# Patient Record
Sex: Male | Born: 1940 | Race: White | Hispanic: No | Marital: Married | Smoking: Current every day smoker
Health system: Southern US, Community
[De-identification: ages and names within clinical notes are randomized; demographics above are authoritative.]

## PROBLEM LIST (undated history)

## (undated) DIAGNOSIS — R569 Unspecified convulsions: Secondary | ICD-10-CM

## (undated) DIAGNOSIS — I10 Essential (primary) hypertension: Secondary | ICD-10-CM

## (undated) DIAGNOSIS — E7800 Pure hypercholesterolemia, unspecified: Secondary | ICD-10-CM

## (undated) DIAGNOSIS — J449 Chronic obstructive pulmonary disease, unspecified: Secondary | ICD-10-CM

## (undated) DIAGNOSIS — R0602 Shortness of breath: Secondary | ICD-10-CM

## (undated) HISTORY — PX: ORTHOPEDIC SURGERY: SHX850

## (undated) HISTORY — PX: BACK SURGERY: SHX140

## (undated) HISTORY — PX: CERVICAL DISC SURGERY: SHX588

---

## 2001-10-08 ENCOUNTER — Encounter: Payer: Self-pay | Admitting: Emergency Medicine

## 2001-10-08 ENCOUNTER — Inpatient Hospital Stay (HOSPITAL_COMMUNITY): Admission: EM | Admit: 2001-10-08 | Discharge: 2001-10-10 | Payer: Self-pay | Admitting: Emergency Medicine

## 2001-10-09 ENCOUNTER — Encounter: Payer: Self-pay | Admitting: Neurology

## 2001-10-09 ENCOUNTER — Encounter: Payer: Self-pay | Admitting: General Surgery

## 2001-11-14 ENCOUNTER — Encounter: Payer: Self-pay | Admitting: Neurosurgery

## 2001-11-18 ENCOUNTER — Inpatient Hospital Stay (HOSPITAL_COMMUNITY): Admission: RE | Admit: 2001-11-18 | Discharge: 2001-11-20 | Payer: Self-pay | Admitting: Neurosurgery

## 2001-11-18 ENCOUNTER — Encounter: Payer: Self-pay | Admitting: Neurosurgery

## 2011-04-15 ENCOUNTER — Other Ambulatory Visit: Payer: Self-pay

## 2011-04-15 ENCOUNTER — Emergency Department (HOSPITAL_COMMUNITY): Payer: Medicare Other

## 2011-04-15 ENCOUNTER — Inpatient Hospital Stay (HOSPITAL_COMMUNITY)
Admission: EM | Admit: 2011-04-15 | Discharge: 2011-04-19 | DRG: 194 | Disposition: A | Discharge status: Home / Self Care | Payer: Medicare Other | Attending: Internal Medicine | Admitting: Internal Medicine

## 2011-04-15 ENCOUNTER — Encounter (HOSPITAL_COMMUNITY): Payer: Self-pay | Admitting: Emergency Medicine

## 2011-04-15 DIAGNOSIS — E785 Hyperlipidemia, unspecified: Secondary | ICD-10-CM

## 2011-04-15 DIAGNOSIS — J069 Acute upper respiratory infection, unspecified: Secondary | ICD-10-CM | POA: Diagnosis present

## 2011-04-15 DIAGNOSIS — E119 Type 2 diabetes mellitus without complications: Secondary | ICD-10-CM

## 2011-04-15 DIAGNOSIS — F17200 Nicotine dependence, unspecified, uncomplicated: Secondary | ICD-10-CM | POA: Diagnosis present

## 2011-04-15 DIAGNOSIS — J09X2 Influenza due to identified novel influenza A virus with other respiratory manifestations: Principal | ICD-10-CM | POA: Diagnosis present

## 2011-04-15 DIAGNOSIS — R946 Abnormal results of thyroid function studies: Secondary | ICD-10-CM | POA: Diagnosis present

## 2011-04-15 DIAGNOSIS — Z720 Tobacco use: Secondary | ICD-10-CM

## 2011-04-15 DIAGNOSIS — I10 Essential (primary) hypertension: Secondary | ICD-10-CM | POA: Diagnosis present

## 2011-04-15 DIAGNOSIS — Z9181 History of falling: Secondary | ICD-10-CM

## 2011-04-15 DIAGNOSIS — R5381 Other malaise: Secondary | ICD-10-CM | POA: Diagnosis present

## 2011-04-15 DIAGNOSIS — J441 Chronic obstructive pulmonary disease with (acute) exacerbation: Secondary | ICD-10-CM | POA: Diagnosis present

## 2011-04-15 DIAGNOSIS — R509 Fever, unspecified: Secondary | ICD-10-CM | POA: Diagnosis present

## 2011-04-15 DIAGNOSIS — R6510 Systemic inflammatory response syndrome (SIRS) of non-infectious origin without acute organ dysfunction: Secondary | ICD-10-CM | POA: Diagnosis present

## 2011-04-15 DIAGNOSIS — E871 Hypo-osmolality and hyponatremia: Secondary | ICD-10-CM | POA: Diagnosis present

## 2011-04-15 HISTORY — DX: Essential (primary) hypertension: I10

## 2011-04-15 HISTORY — DX: Pure hypercholesterolemia, unspecified: E78.00

## 2011-04-15 LAB — DIFFERENTIAL
Basophils Relative: 0 % (ref 0–1)
Eosinophils Absolute: 0 10*3/uL (ref 0.0–0.7)
Lymphs Abs: 0.4 10*3/uL — ABNORMAL LOW (ref 0.7–4.0)
Monocytes Absolute: 1.3 10*3/uL — ABNORMAL HIGH (ref 0.1–1.0)
Monocytes Relative: 9 % (ref 3–12)
Neutro Abs: 13.1 10*3/uL — ABNORMAL HIGH (ref 1.7–7.7)

## 2011-04-15 LAB — BASIC METABOLIC PANEL
BUN: 12 mg/dL (ref 6–23)
Chloride: 92 mEq/L — ABNORMAL LOW (ref 96–112)
Creatinine, Ser: 0.84 mg/dL (ref 0.50–1.35)
GFR calc Af Amer: >90 mL/min (ref >90)
Glucose, Bld: 155 mg/dL — ABNORMAL HIGH (ref 70–99)

## 2011-04-15 LAB — URINALYSIS, ROUTINE W REFLEX MICROSCOPIC
Bilirubin Urine: NEGATIVE
Hgb urine dipstick: NEGATIVE
Ketones, ur: 15 mg/dL — AB
Nitrite: NEGATIVE
pH: 5.5 (ref 5.0–8.0)

## 2011-04-15 LAB — CBC
HCT: 43 % (ref 39.0–52.0)
Hemoglobin: 15.5 g/dL (ref 13.0–17.0)
MCH: 33.4 pg (ref 26.0–34.0)
MCHC: 36 g/dL (ref 30.0–36.0)

## 2011-04-15 MED ORDER — SODIUM CHLORIDE 0.9 % IV BOLUS (SEPSIS)
500.0000 mL | Freq: Once | INTRAVENOUS | Status: AC
  Administered 2011-04-15: 500 mL via INTRAVENOUS

## 2011-04-15 MED ORDER — LISINOPRIL 20 MG PO TABS
20.0000 mg | ORAL_TABLET | Freq: Every day | ORAL | Status: DC
  Administered 2011-04-16 – 2011-04-19 (×4): 20 mg via ORAL
  Filled 2011-04-15 (×4): qty 1

## 2011-04-15 MED ORDER — PREDNISONE 20 MG PO TABS
60.0000 mg | ORAL_TABLET | Freq: Once | ORAL | Status: AC
  Administered 2011-04-15: 60 mg via ORAL

## 2011-04-15 MED ORDER — ENOXAPARIN SODIUM 40 MG/0.4ML ~~LOC~~ SOLN
40.0000 mg | Freq: Every day | SUBCUTANEOUS | Status: DC
  Administered 2011-04-16: 40 mg via SUBCUTANEOUS
  Filled 2011-04-15 (×2): qty 0.4

## 2011-04-15 MED ORDER — ALBUTEROL SULFATE (5 MG/ML) 0.5% IN NEBU
5.0000 mg | INHALATION_SOLUTION | Freq: Once | RESPIRATORY_TRACT | Status: AC
  Administered 2011-04-15: 5 mg via RESPIRATORY_TRACT
  Filled 2011-04-15: qty 1

## 2011-04-15 MED ORDER — BENZONATATE 100 MG PO CAPS
100.0000 mg | ORAL_CAPSULE | Freq: Three times a day (TID) | ORAL | Status: DC | PRN
  Filled 2011-04-15: qty 1

## 2011-04-15 MED ORDER — ASPIRIN EC 325 MG PO TBEC
325.0000 mg | DELAYED_RELEASE_TABLET | Freq: Every day | ORAL | Status: DC
  Administered 2011-04-16 – 2011-04-19 (×4): 325 mg via ORAL
  Filled 2011-04-15 (×4): qty 1

## 2011-04-15 MED ORDER — GLIMEPIRIDE 2 MG PO TABS
2.0000 mg | ORAL_TABLET | Freq: Every day | ORAL | Status: DC
  Administered 2011-04-16 – 2011-04-19 (×4): 2 mg via ORAL
  Filled 2011-04-15 (×4): qty 1

## 2011-04-15 MED ORDER — ALBUTEROL SULFATE (5 MG/ML) 0.5% IN NEBU
2.5000 mg | INHALATION_SOLUTION | RESPIRATORY_TRACT | Status: DC | PRN
  Administered 2011-04-17: 2.5 mg via RESPIRATORY_TRACT
  Filled 2011-04-15: qty 0.5

## 2011-04-15 MED ORDER — SIMVASTATIN 20 MG PO TABS
20.0000 mg | ORAL_TABLET | Freq: Every day | ORAL | Status: DC
  Administered 2011-04-16 – 2011-04-18 (×3): 20 mg via ORAL
  Filled 2011-04-15 (×4): qty 1

## 2011-04-15 MED ORDER — NICOTINE 14 MG/24HR TD PT24
14.0000 mg | MEDICATED_PATCH | Freq: Every day | TRANSDERMAL | Status: DC
  Administered 2011-04-16 – 2011-04-17 (×2): 14 mg via TRANSDERMAL
  Filled 2011-04-15 (×5): qty 1

## 2011-04-15 MED ORDER — PHENOBARBITAL 100 MG PO TABS
100.0000 mg | ORAL_TABLET | Freq: Every day | ORAL | Status: DC
  Filled 2011-04-15: qty 1

## 2011-04-15 MED ORDER — SODIUM CHLORIDE 0.9 % IV SOLN: Freq: Once | INTRAVENOUS | Status: DC

## 2011-04-15 MED ORDER — METHYLPREDNISOLONE SODIUM SUCC 40 MG IJ SOLR
40.0000 mg | Freq: Two times a day (BID) | INTRAMUSCULAR | Status: DC
  Administered 2011-04-15 – 2011-04-16 (×3): 40 mg via INTRAVENOUS
  Filled 2011-04-15 (×5): qty 1

## 2011-04-15 MED ORDER — METFORMIN HCL 500 MG PO TABS
1000.0000 mg | ORAL_TABLET | Freq: Two times a day (BID) | ORAL | Status: DC
  Administered 2011-04-16 – 2011-04-19 (×7): 1000 mg via ORAL
  Filled 2011-04-15 (×8): qty 2

## 2011-04-15 MED ORDER — INSULIN ASPART 100 UNIT/ML ~~LOC~~ SOLN: 0.0000 [IU] | Freq: Every day | SUBCUTANEOUS | Status: DC

## 2011-04-15 MED ORDER — MORPHINE SULFATE 2 MG/ML IJ SOLN: 1.0000 mg | INTRAMUSCULAR | Status: DC | PRN

## 2011-04-15 MED ORDER — ALUM & MAG HYDROXIDE-SIMETH 200-200-20 MG/5ML PO SUSP: 30.0000 mL | Freq: Four times a day (QID) | ORAL | Status: DC | PRN

## 2011-04-15 MED ORDER — ACETAMINOPHEN 325 MG PO TABS
650.0000 mg | ORAL_TABLET | Freq: Once | ORAL | Status: AC
Start: 2011-04-15 — End: 2011-04-15
  Administered 2011-04-15: 650 mg via ORAL
  Filled 2011-04-15: qty 2

## 2011-04-15 MED ORDER — LEVOFLOXACIN IN D5W 500 MG/100ML IV SOLN
500.0000 mg | INTRAVENOUS | Status: DC
  Administered 2011-04-16 – 2011-04-18 (×4): 500 mg via INTRAVENOUS
  Filled 2011-04-15 (×5): qty 100

## 2011-04-15 MED ORDER — IPRATROPIUM BROMIDE 0.02 % IN SOLN
0.5000 mg | RESPIRATORY_TRACT | Status: DC | PRN
  Administered 2011-04-17: 0.5 mg via RESPIRATORY_TRACT
  Filled 2011-04-15 (×3): qty 2.5

## 2011-04-15 MED ORDER — INSULIN ASPART 100 UNIT/ML ~~LOC~~ SOLN
0.0000 [IU] | Freq: Three times a day (TID) | SUBCUTANEOUS | Status: DC
  Administered 2011-04-16: 3 [IU] via SUBCUTANEOUS
  Administered 2011-04-17 – 2011-04-18 (×2): 2 [IU] via SUBCUTANEOUS
  Filled 2011-04-15: qty 3

## 2011-04-15 MED ORDER — IPRATROPIUM BROMIDE 0.02 % IN SOLN
0.5000 mg | Freq: Once | RESPIRATORY_TRACT | Status: AC
  Administered 2011-04-15: 0.5 mg via RESPIRATORY_TRACT
  Filled 2011-04-15: qty 2.5

## 2011-04-15 MED ORDER — ONDANSETRON HCL 4 MG/2ML IJ SOLN: 4.0000 mg | Freq: Four times a day (QID) | INTRAMUSCULAR | Status: DC | PRN

## 2011-04-15 MED ORDER — HYDROCODONE-ACETAMINOPHEN 5-325 MG PO TABS
1.0000 | ORAL_TABLET | ORAL | Status: DC | PRN
  Administered 2011-04-15: 1 via ORAL
  Administered 2011-04-17: 2 via ORAL
  Administered 2011-04-17: 1 via ORAL
  Filled 2011-04-15 (×3): qty 1

## 2011-04-15 MED ORDER — ONDANSETRON HCL 4 MG PO TABS: 4.0000 mg | ORAL_TABLET | Freq: Four times a day (QID) | ORAL | Status: DC | PRN

## 2011-04-15 NOTE — ED Notes (Signed)
General body aches, fever, not feeling well since last Thursday.  Problems with balance.  Patient tends to fall backwards with ambulation.  Patient is from home.  Heavy smoker. Denies SHOB, n/v/d.

## 2011-04-15 NOTE — H&P (Signed)
PCP:  Dr. Wilburt Finlay  Chief Complaint:  Fall   HPI: This is a 71 year old gentleman who states he's had a cold since Thursday. He states he's been having shortness of breath and wheezing, he's had a persistent nonproductive cough, with a low-grade fever. No nausea no vomiting no diarrhea. He's been getting progressively weaker, lightheaded and dizzy. He fell 2 days ago, twice yesterday and twice today. He states he has developed a right hip, that started several months ago and has been becoming gradually worse. He additionally has a leg, thigh and calf cramps, which is chronic but has been worse since feeling ill. He states his falls are mechanical. he has no loss of consciousness, no chest pains, no palpitations. He states the heating blanket helps his hip pain as well as the cramps. He came to the ER today because of his increasing fall. He states his wife is also ill but she is getting better. The patient does smoke daily. History provided by the patient was alert and oriented. In the ER the patient was found to have a temperature 102.4.  Review of Systems: Positives bolded anorexia, fever, weight loss,, vision loss, decreased hearing, hoarseness, chest pain, syncope, dyspnea on exertion, peripheral edema, balance deficits, hemoptysis, abdominal pain, melena, hematochezia, severe indigestion/heartburn, hematuria, incontinence, genital sores, muscle weakness, suspicious skin lesions, transient blindness, difficulty walking, depression, unusual weight change, abnormal bleeding, enlarged lymph nodes, angioedema, and breast masses.  Past Medical History: Past Medical History  Diagnosis Date  . Diabetes mellitus   . Hypertension   . Hypercholesteremia    Past Surgical History  Procedure Date  . Orthopedic surgery     lumbar, cervical    Medications: Prior to Admission medications   Medication Sig Start Date End Date Taking? Authorizing Provider  aspirin 325 MG EC tablet Take 325 mg by  mouth daily.   Yes Historical Provider, MD  glimepiride (AMARYL) 2 MG tablet Take 2 mg by mouth daily before breakfast.   Yes Historical Provider, MD  lisinopril (PRINIVIL,ZESTRIL) 20 MG tablet Take 20 mg by mouth daily.   Yes Historical Provider, MD  metFORMIN (GLUCOPHAGE) 1000 MG tablet Take 1,000 mg by mouth 2 (two) times daily with a meal.   Yes Historical Provider, MD  PHENObarbital (LUMINAL) 100 MG tablet Take 100 mg by mouth daily.   Yes Historical Provider, MD  pravastatin (PRAVACHOL) 40 MG tablet Take 40 mg by mouth daily.   Yes Historical Provider, MD    Allergies:  No Known Allergies  Social History:  reports that he has been smoking Cigarettes.  He has a 110 pack-year smoking history. He does not have any smokeless tobacco history on file. He reports that he does not drink alcohol or use illicit drugs.  Family History: Family History  Problem Relation Age of Onset  . Diabetes type II      Physical Exam: Filed Vitals:   04/15/11 1753 04/15/11 1940  BP: 145/77   Temp: 102.4 F (39.1 C)   TempSrc: Oral   Resp: 24   Height: 5\' 9"  (1.753 m)   Weight: 100.245 kg (221 lb)   SpO2: 89% 94%    General:  Alert and oriented times three, well developed and nourished, no acute distress Eyes: PERRLA, pink conjunctiva, no scleral icterus ENT: Moist oral mucosa, neck supple, no thyromegaly Lungs: Wheezing throughout, no crackles, no use of accessory muscles Cardiovascular: regular rate and rhythm, no regurgitation, no gallops, no murmurs. No carotid bruits, no JVD Abdomen: soft,  positive BS, non-tender, non-distended, no organomegaly, not an acute abdomen GU: not examined Neuro: CN II - XII grossly intact, sensation intact Musculoskeletal: strength 5/5 all extremities, no clubbing, cyanosis or edema Skin: no rash, no subcutaneous crepitation, no decubitus Psych: appropriate patient   Labs on Admission:   Basename 04/15/11 1755  NA 126*  K 4.4  CL 92*  CO2 21  GLUCOSE  155*  BUN 12  CREATININE 0.84  CALCIUM 9.2  MG --  PHOS --   No results found for this basename: AST:2,ALT:2,ALKPHOS:2,BILITOT:2,PROT:2,ALBUMIN:2 in the last 72 hours No results found for this basename: LIPASE:2,AMYLASE:2 in the last 72 hours  Basename 04/15/11 1755  WBC 14.7*  NEUTROABS 13.1*  HGB 15.5  HCT 43.0  MCV 92.7  PLT 223   No results found for this basename: CKTOTAL:3,CKMB:3,CKMBINDEX:3,TROPONINI:3 in the last 72 hours No components found with this basename: POCBNP:3 No results found for this basename: DDIMER:2 in the last 72 hours No results found for this basename: HGBA1C:2 in the last 72 hours No results found for this basename: CHOL:2,HDL:2,LDLCALC:2,TRIG:2,CHOLHDL:2,LDLDIRECT:2 in the last 72 hours No results found for this basename: TSH,T4TOTAL,FREET3,T3FREE,THYROIDAB in the last 72 hours No results found for this basename: VITAMINB12:2,FOLATE:2,FERRITIN:2,TIBC:2,IRON:2,RETICCTPCT:2 in the last 72 hours  Micro Results: No results found for this or any previous visit (from the past 240 hour(s)).   Radiological Exams on Admission: Dg Chest 2 View  04/15/2011  *RADIOLOGY REPORT*  Clinical Data: Shortness of breath.  Cough and fever.  History of smoking.  CHEST - 2 VIEW  Comparison: None.  Findings: The heart size and mediastinal contours are normal.  The lungs demonstrate diffuse central airway thickening without hyperinflation or confluent airspace opacity.  There is no pleural effusion. The patient is status post lower cervical fusion and apparent distal left clavicle resection.  IMPRESSION: Chronic bronchitis.  No acute cardiopulmonary process identified.  Original Report Authenticated By: Gerrianne Scale, M.D.    Assessment/Plan Present on Admission:  .COPD with acute exacerbation .SIRS (systemic inflammatory response syndrome) Tobacco abuse Admit to MedSurg Solu-Medrol, nebulizers, Tessalon Perles, oxygen, empiric antibiotics Nicotine patch Rule out  influenza, the Tamiflu as patient is out of the treatment window Hyponatremia IV fluid hydration Diabetes mellitus Hypertension Dyslipidemia Stable, resume home medications and ADA diet and sliding scale insulin  Full code DVT prophylaxis Team 2/Dr. Nadara Mustard, Erika Hussar 04/15/2011, 9:25 PM

## 2011-04-15 NOTE — ED Provider Notes (Signed)
Medical screening examination/treatment/procedure(s) were conducted as a shared visit with non-physician practitioner(s) and myself.  I personally evaluated the patient during the encounter  Patient most likely with flulike symptoms main symptoms are generalized bodyaches fever not feeling well since last Thursday also having trouble with dizziness and some balance patient has almost fallen several times. Again suspect flulike illness patient too ill to go home we'll require admission hospitalist team will admit.  Shelda Jakes, MD 04/15/11 2124

## 2011-04-15 NOTE — ED Provider Notes (Signed)
History     CSN: [id number]  Arrival date & time 04/15/11  1744   First MD Initiated Contact with Patient 04/15/11 1921      Chief Complaint  Patient presents with  . Generalized Body Aches  . Fever    (Consider location/radiation/quality/duration/timing/severity/associated sxs/prior treatment) Patient is a 71 y.o. male presenting with URI. The history is provided by the patient.  URI The primary symptoms include fever, cough, wheezing, nausea and myalgias. Primary symptoms do not include ear pain, sore throat, abdominal pain, vomiting or rash. The current episode started 3 to 5 days ago. This is a new problem. The problem has been gradually worsening.  Symptoms associated with the illness include chills and congestion. The illness is not associated with sinus pressure.  Pt states both him and his wife has had flu like illness for about 4 days, with nasal congestion, cough, body aches, weakness. States took advil this morning otherwise no other meidcations. State that he came in because continues to feel weak, continues to have fever, and now short of breath. Denies headache or neck stiffness, no vomiting, abdominal pain, urinary symptoms. States also today, he is having weakness in legs and unable to walk, states feels like going to faint when walking.   Past Medical History  Diagnosis Date  . Diabetes mellitus   . Hypertension   . Hypercholesteremia     History reviewed. No pertinent past surgical history.  History reviewed. No pertinent family history.  History  Substance Use Topics  . Smoking status: Current Everyday Smoker -- 2.0 packs/day for 55 years    Types: Cigarettes  . Smokeless tobacco: Not on file  . Alcohol Use: No      Review of Systems  Constitutional: Positive for fever and chills.  HENT: Positive for congestion. Negative for ear pain, sore throat and sinus pressure.   Eyes: Negative.   Respiratory: Positive for cough and wheezing. Negative for chest  tightness.   Cardiovascular: Negative for chest pain, palpitations and leg swelling.  Gastrointestinal: Positive for nausea. Negative for vomiting and abdominal pain.  Genitourinary: Negative.   Musculoskeletal: Positive for myalgias.  Skin: Negative for rash.  Neurological: Negative.   Psychiatric/Behavioral: Negative.     Allergies  Review of patient's allergies indicates no known allergies.  Home Medications   Current Outpatient Rx  Name Route Sig Dispense Refill  . GLIMEPIRIDE 2 MG PO TABS Oral Take 2 mg by mouth daily before breakfast.    . LISINOPRIL 20 MG PO TABS Oral Take 20 mg by mouth daily.    Marland Kitchen METFORMIN HCL 1000 MG PO TABS Oral Take 1,000 mg by mouth 2 (two) times daily with a meal.    . PHENOBARBITAL 100 MG PO TABS Oral Take 100 mg by mouth daily.    Marland Kitchen PRAVASTATIN SODIUM 40 MG PO TABS Oral Take 40 mg by mouth daily.      BP 145/77  Temp(Src) 102.4 F (39.1 C) (Oral)  Resp 24  Ht 5\' 9"  (1.753 m)  Wt 221 lb (100.245 kg)  BMI 32.64 kg/m2  SpO2 89%  Physical Exam  Nursing note and vitals reviewed. Constitutional: He is oriented to person, place, and time. He appears well-developed and well-nourished. No distress.  Eyes: Conjunctivae are normal.  Neck: Neck supple.  Cardiovascular: Regular rhythm and normal heart sounds.        tachycardic  Pulmonary/Chest: Effort normal.       Tachepnic, expiratory wheezes bilaterally, rales in the right lower lung  Abdominal: Soft. Bowel sounds are normal. He exhibits no distension. There is no tenderness.  Musculoskeletal: Normal range of motion. He exhibits no edema.  Neurological: He is alert and oriented to person, place, and time.  Skin: Skin is warm and dry. No rash noted.  Psychiatric: He has a normal mood and affect.    ED Course  Procedures (including critical care time)  Labs Reviewed  GLUCOSE, CAPILLARY - Abnormal; Notable for the following:    Glucose-Capillary 159 (*)    All other components within normal  limits  CBC - Abnormal; Notable for the following:    WBC 14.7 (*)    All other components within normal limits  DIFFERENTIAL - Abnormal; Notable for the following:    Neutrophils Relative 89 (*)    Neutro Abs 13.1 (*)    Lymphocytes Relative 3 (*)    Lymphs Abs 0.4 (*)    Monocytes Absolute 1.3 (*)    All other components within normal limits  POCT CBG MONITORING  BASIC METABOLIC PANEL  URINALYSIS, ROUTINE W REFLEX MICROSCOPIC   7:40 PM Pt is febrile, tachycardic, coughing, wheezing on exam. Will get CXR, labs, breathing treatment ordered. Will keep on the monitor.    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 Tachepnic. Will admit.   Spoke with [facility] will admit No diagnosis found.    MDM          Lottie Mussel, PA 04/15/11 2055

## 2011-04-16 LAB — CULTURE, RESPIRATORY W GRAM STAIN: Culture: NORMAL

## 2011-04-16 LAB — BASIC METABOLIC PANEL
BUN: 11 mg/dL (ref 6–23)
CO2: 22 mEq/L (ref 19–32)
GFR calc non Af Amer: 90 mL/min — ABNORMAL LOW (ref >90)
Glucose, Bld: 169 mg/dL — ABNORMAL HIGH (ref 70–99)
Potassium: 4.4 mEq/L (ref 3.5–5.1)

## 2011-04-16 LAB — CBC
HCT: 39.2 % (ref 39.0–52.0)
Hemoglobin: 13.9 g/dL (ref 13.0–17.0)
MCH: 32.6 pg (ref 26.0–34.0)
MCHC: 35.5 g/dL (ref 30.0–36.0)
MCV: 92 fL (ref 78.0–100.0)
RBC: 4.26 MIL/uL (ref 4.22–5.81)

## 2011-04-16 LAB — OSMOLALITY, URINE
Osmolality, Ur: 474 mOsm/kg (ref 390–1090)
Osmolality, Ur: 317 mOsm/kg — ABNORMAL LOW (ref 390–1090)

## 2011-04-16 LAB — SODIUM, URINE, RANDOM: Sodium, Ur: 29 mEq/L

## 2011-04-16 LAB — INFLUENZA PANEL BY PCR (TYPE A & B)
H1N1 flu by pcr: NOT DETECTED
Influenza A By PCR: POSITIVE — AB
Influenza B By PCR: NEGATIVE

## 2011-04-16 LAB — GLUCOSE, CAPILLARY
Glucose-Capillary: 161 mg/dL — ABNORMAL HIGH (ref 70–99)
Glucose-Capillary: 116 mg/dL — ABNORMAL HIGH (ref 70–99)
Glucose-Capillary: 98 mg/dL (ref 70–99)

## 2011-04-16 LAB — T3, FREE: T3, Free: 2.2 pg/mL — ABNORMAL LOW (ref 2.3–4.2)

## 2011-04-16 MED ORDER — PHENOBARBITAL 97.2 MG PO TABS
97.2000 mg | ORAL_TABLET | Freq: Every day | ORAL | Status: DC
  Administered 2011-04-16 – 2011-04-18 (×3): 97.2 mg via ORAL
  Filled 2011-04-16 (×3): qty 1

## 2011-04-16 MED ORDER — SODIUM CHLORIDE 0.9 % IV SOLN
INTRAVENOUS | Status: AC
  Administered 2011-04-16: 18:00:00 via INTRAVENOUS

## 2011-04-16 MED ORDER — IBUPROFEN 400 MG PO TABS
400.0000 mg | ORAL_TABLET | Freq: Four times a day (QID) | ORAL | Status: DC
  Administered 2011-04-16 (×2): 400 mg via ORAL
  Filled 2011-04-16 (×4): qty 1

## 2011-04-16 MED ORDER — OSELTAMIVIR PHOSPHATE 75 MG PO CAPS
75.0000 mg | ORAL_CAPSULE | Freq: Two times a day (BID) | ORAL | Status: DC
  Administered 2011-04-16 – 2011-04-19 (×7): 75 mg via ORAL
  Filled 2011-04-16 (×8): qty 1

## 2011-04-16 NOTE — Progress Notes (Signed)
UR completed 

## 2011-04-16 NOTE — Progress Notes (Signed)
Keith Wise:[id number],EXB:[id number] is a 71 y.o. male,  Outpatient Primary MD for the patient is No primary provider on file.  Chief Complaint  Patient presents with  . Generalized Body Aches  . Fever        Subjective:   [place] today has, No headache, No chest pain, No abdominal pain - No Nausea, No new weakness tingling or numbness, ++ Cough , mild SOB.  ++ body aches.  Objective:   Filed Vitals:   04/15/11 1940 04/15/11 2208 04/15/11 2242 04/16/11 0457  BP:  143/73 147/77 133/70  Pulse:   97 88  Temp:  99.7 F (37.6 C) 99.4 F (37.4 C) 99.5 F (37.5 C)  TempSrc:   Oral Oral  Resp:  28 20 20   Height:   5\' 9"  (1.753 m)   Weight:   100.245 kg (221 lb)   SpO2: 94% 97% 92% 94%    Wt Readings from Last 3 Encounters:  04/15/11 100.245 kg (221 lb)     Intake/Output Summary (Last 24 hours) at 04/16/11 1337 Last data filed at 04/16/11 0900  Gross per 24 hour  Intake    580 ml  Output   1450 ml  Net   -870 ml    Exam Awake Alert, Oriented *3, No new F.N deficits, Normal affect [place].AT,PERRAL Supple Neck,No JVD, No cervical lymphadenopathy appriciated.  Symmetrical Chest wall movement, Good air movement bilaterally, few wheezes RRR,No Gallops,Rubs or new Murmurs, No Parasternal Heave +ve B.Sounds, Abd Soft, Non tender, No organomegaly appriciated, No rebound -guarding or rigidity. No Cyanosis, Clubbing or edema, No new Rash or bruise     Data Review  CBC  Lab 04/16/11 0440 04/15/11 1755  WBC 16.2* 14.7*  HGB 13.9 15.5  HCT 39.2 43.0  PLT 221 223  MCV 92.0 92.7  MCH 32.6 33.4  MCHC 35.5 36.0  RDW 12.6 12.9  LYMPHSABS -- 0.4*  MONOABS -- 1.3*  EOSABS -- 0.0  BASOSABS -- 0.0  BANDABS -- --    Chemistries   Lab 04/16/11 0440 04/15/11 1755  NA 127* 126*  K 4.4 4.4  CL 93* 92*  CO2 22 21  GLUCOSE 169* 155*  BUN 11 12  CREATININE 0.80 0.84  CALCIUM 8.8 9.2  MG -- --  AST -- --  ALT -- --  ALKPHOS -- --  BILITOT -- --    ------------------------------------------------------------------------------------------------------------------ estimated creatinine clearance is 103.1 ml/min (by C-G formula based on Cr of 0.8). ------------------------------------------------------------------------------------------------------------------ No results found for this basename: HGBA1C:2 in the last 72 hours ------------------------------------------------------------------------------------------------------------------ No results found for this basename: CHOL:2,HDL:2,LDLCALC:2,TRIG:2,CHOLHDL:2,LDLDIRECT:2 in the last 72 hours ------------------------------------------------------------------------------------------------------------------  Basename 04/15/11 1755  TSH 0.244*  T4TOTAL --  T3FREE --  THYROIDAB --   ------------------------------------------------------------------------------------------------------------------ No results found for this basename: VITAMINB12:2,FOLATE:2,FERRITIN:2,TIBC:2,IRON:2,RETICCTPCT:2 in the last 72 hours  Coagulation profile No results found for this basename: INR:5,PROTIME:5 in the last 168 hours  No results found for this basename: DDIMER:2 in the last 72 hours  Cardiac Enzymes No results found for this basename: CK:3,CKMB:3,TROPONINI:3,MYOGLOBIN:3 in the last 168 hours ------------------------------------------------------------------------------------------------------------------ No components found with this basename: POCBNP:3  Micro Results No results found for this or any previous visit (from the past 240 hour(s)).  Radiology Reports Dg Chest 2 View  04/15/2011  *RADIOLOGY REPORT*  Clinical Data: Shortness of breath.  Cough and fever.  History of smoking.  CHEST - 2 VIEW  Comparison: None.  Findings: The heart size and mediastinal contours are normal.  The lungs demonstrate diffuse central  airway thickening without hyperinflation or confluent airspace opacity.  There  is no pleural effusion. The patient is status post lower cervical fusion and apparent distal left clavicle resection.  IMPRESSION: Chronic bronchitis.  No acute cardiopulmonary process identified.  Original Report Authenticated By: Gerrianne Scale, M.D.    Scheduled Meds:   . acetaminophen  650 mg Oral Once  . albuterol  5 mg Nebulization Once  . aspirin  325 mg Oral Daily  . enoxaparin  40 mg Subcutaneous QHS  . glimepiride  2 mg Oral QAC breakfast  . ibuprofen  400 mg Oral QID  . insulin aspart  0-15 Units Subcutaneous TID WC  . insulin aspart  0-5 Units Subcutaneous QHS  . ipratropium  0.5 mg Nebulization Once  . levofloxacin (LEVAQUIN) IV  500 mg Intravenous Q24H  . lisinopril  20 mg Oral Daily  . metFORMIN  1,000 mg Oral BID WC  . methylPREDNISolone (SOLU-MEDROL) injection  40 mg Intravenous Q12H  . nicotine  14 mg Transdermal Daily  . oseltamivir  75 mg Oral BID  . PHENObarbital  97.2 mg Oral Daily  . predniSONE  60 mg Oral Once  . simvastatin  20 mg Oral q1800  . sodium chloride  500 mL Intravenous Once  . DISCONTD: sodium chloride   Intravenous Once  . DISCONTD: PHENObarbital  100 mg Oral Daily   Continuous Infusions:   . sodium chloride     PRN Meds:.albuterol, alum & mag hydroxide-simeth, benzonatate, HYDROcodone-acetaminophen, ipratropium, morphine, ondansetron (ZOFRAN) IV, ondansetron  Assessment & Plan   1. SIRS due to Infl A URI + COPD exacerbation - D/W Dr Bary Richard PCCM, looking at the clinical picture despite the timeframe will challenge him with tamiflu, continue steroids and Levaquin, o2-nebs, Pox monitor.   2.Tobacco abuse - counseled, Nic patch.   3. DM-2 - check A1c, home Meds + ISS    4. H/O  Hypertension &  Hyperlipidemia - no acute issues continue on statin, monitor BP   5. Hyponatremia - check Ur Osm-Na, Sr Osm, gentle IVF.   6. Low TSH - check free T3- free t4    7. Weakness falls due to #1, PT-OT.    DVT Prophylaxis  Lovenox     See all Orders from today for further details     Leroy Sea M.D on 04/16/2011 at 1:37 PM  [facility] Office  [phone]

## 2011-04-17 LAB — BASIC METABOLIC PANEL
BUN: 16 mg/dL (ref 6–23)
Calcium: 8.7 mg/dL (ref 8.4–10.5)
GFR calc Af Amer: >90 mL/min (ref >90)
GFR calc non Af Amer: >90 mL/min (ref >90)
Potassium: 4.3 mEq/L (ref 3.5–5.1)

## 2011-04-17 LAB — SODIUM, URINE, RANDOM: Sodium, Ur: 21 mEq/L

## 2011-04-17 LAB — CBC
Hemoglobin: 13.3 g/dL (ref 13.0–17.0)
MCH: 32.5 pg (ref 26.0–34.0)
MCHC: 35.5 g/dL (ref 30.0–36.0)
Platelets: 222 10*3/uL (ref 150–400)
RDW: 12.6 % (ref 11.5–15.5)

## 2011-04-17 LAB — MAGNESIUM: Magnesium: 2.1 mg/dL (ref 1.5–2.5)

## 2011-04-17 LAB — GLUCOSE, CAPILLARY
Glucose-Capillary: 101 mg/dL — ABNORMAL HIGH (ref 70–99)
Glucose-Capillary: 122 mg/dL — ABNORMAL HIGH (ref 70–99)

## 2011-04-17 LAB — OSMOLALITY: Osmolality: 270 mOsm/kg — ABNORMAL LOW (ref 275–300)

## 2011-04-17 MED ORDER — PREDNISONE 20 MG PO TABS
40.0000 mg | ORAL_TABLET | Freq: Every day | ORAL | Status: DC
  Administered 2011-04-18 – 2011-04-19 (×2): 40 mg via ORAL
  Filled 2011-04-17 (×2): qty 2

## 2011-04-17 MED ORDER — ZOLPIDEM TARTRATE 5 MG PO TABS
5.0000 mg | ORAL_TABLET | Freq: Every evening | ORAL | Status: DC | PRN
  Administered 2011-04-17 (×2): 5 mg via ORAL
  Filled 2011-04-17 (×2): qty 1

## 2011-04-17 MED ORDER — ENOXAPARIN SODIUM 40 MG/0.4ML ~~LOC~~ SOLN
40.0000 mg | SUBCUTANEOUS | Status: DC
  Administered 2011-04-17 – 2011-04-19 (×3): 40 mg via SUBCUTANEOUS
  Filled 2011-04-17 (×3): qty 0.4

## 2011-04-17 MED ORDER — ALBUTEROL SULFATE (5 MG/ML) 0.5% IN NEBU
2.5000 mg | INHALATION_SOLUTION | Freq: Three times a day (TID) | RESPIRATORY_TRACT | Status: DC
  Administered 2011-04-17 – 2011-04-19 (×7): 2.5 mg via RESPIRATORY_TRACT
  Filled 2011-04-17 (×8): qty 0.5

## 2011-04-17 MED ORDER — IPRATROPIUM BROMIDE 0.02 % IN SOLN
0.5000 mg | Freq: Three times a day (TID) | RESPIRATORY_TRACT | Status: DC
  Administered 2011-04-17 – 2011-04-18 (×4): 0.5 mg via RESPIRATORY_TRACT
  Filled 2011-04-17 (×3): qty 2.5

## 2011-04-17 MED ORDER — FUROSEMIDE 10 MG/ML IJ SOLN: 20.0000 mg | Freq: Once | INTRAMUSCULAR | Status: DC

## 2011-04-17 MED ORDER — PREDNISOLONE 5 MG PO TABS: 40.0000 mg | ORAL_TABLET | Freq: Every day | ORAL | Status: DC

## 2011-04-17 MED ORDER — SODIUM CHLORIDE 0.9 % IV SOLN
INTRAVENOUS | Status: AC
  Administered 2011-04-17: 10:00:00 via INTRAVENOUS

## 2011-04-17 NOTE — Progress Notes (Signed)
OT Screen Order received, chart reviewed. Pt doing much better today. Per PT and pt, no OT needs identified at this time. Will sign off.  Garrel Ridgel, OTR/L  Pager [phone] 04/17/2011

## 2011-04-17 NOTE — Progress Notes (Signed)
CBG: 63  Treatment: 15 GM carbohydrate snack  Symptoms: None  Follow-up CBG: Time:2151 CBG Result:163  Possible Reasons for Event: Inadequate meal intake  Comments/MD notified:None    [employer]

## 2011-04-17 NOTE — Evaluation (Signed)
Physical Therapy Evaluation One-time eval Patient Details Name: Keith Wise MRN: [id number] DOB: November 12, 1940 Today's Date: 04/17/2011 Time: 840-850 Charge: EVI  Problem List:  Patient Active Problem List  Diagnoses  . COPD with acute exacerbation  . Tobacco abuse  . Diabetes mellitus  . Hypertension  . Hyperlipidemia  . SIRS (systemic inflammatory response syndrome)    Past Medical History:  Past Medical History  Diagnosis Date  . Diabetes mellitus   . Hypertension   . Hypercholesteremia    Past Surgical History:  Past Surgical History  Procedure Date  . Orthopedic surgery     lumbar, cervical    PT Assessment/Plan/Recommendation PT Assessment Clinical Impression Statement: Pt presents with diagnosis of systemic inflammatory response syndrome and positive for flu.  Pt reports overall feeling poorly but did well with mobility.  Pt does not require f/u PT services and agreed to not need PT in acute setting. PT Recommendation/Assessment: Patent does not need any further PT services No Skilled PT: Patient is supervision for all activity/mobility;Patient is modified independent with all activity/mobility PT Recommendation Follow Up Recommendations: No PT follow up Equipment Recommended: None recommended by PT PT Goals     PT Evaluation Precautions/Restrictions    Prior Functioning  Home Living Type of Home: House Home Layout: Able to live on main level with bedroom/bathroom Home Access: Stairs to enter Prior Function Level of Independence: Independent with gait Cognition Cognition Arousal/Alertness: Awake/alert Overall Cognitive Status: Appears within functional limits for tasks assessed Sensation/Coordination   Extremity Assessment RLE Assessment RLE Assessment: Within Functional Limits LLE Assessment LLE Assessment: Within Functional Limits Mobility (including Balance) Bed Mobility Bed Mobility: No (pt up in recliner) Transfers Transfers: Yes Sit to  Stand: 6: Modified independent (Device/Increase time) Stand to Sit: 6: Modified independent (Device/Increase time) Ambulation/Gait Ambulation/Gait: Yes Ambulation/Gait Assistance: 5: Supervision Ambulation/Gait Assistance Details (indicate cue type and reason): pt a little unsteady (no LOB) but reports this is just from overall not feeling well and reports no hx of falls, pt with SaO2 of 95% pre and post gait on room air. Ambulation Distance (Feet): 160 Feet Assistive device: None Gait Pattern:  (wide BOS)    Exercise    End of Session PT - End of Session Activity Tolerance: Patient tolerated treatment well Patient left: in chair;with call bell in reach General Behavior During Session: [name] for tasks performed Cognition: [name] for tasks performed  Keith Wise,Keith Wise 04/17/2011, 11:32 AM  Pager: [phone]

## 2011-04-17 NOTE — Progress Notes (Signed)
Keith Wise ZOX:[id number]:[id number] is a 71 y.o. male,  Outpatient Primary MD for the patient is No primary provider on file.  Chief Complaint  Patient presents with  . Generalized Body Aches  . Fever        Subjective:   Keith Wise today has, No headache, No chest pain, No abdominal pain - No Nausea, No new weakness tingling or numbness, improved Cough & SOB.  improved body aches.  Objective:   Filed Vitals:   04/16/11 2200 04/17/11 0049 04/17/11 0515 04/17/11 0855  BP:   103/56   Pulse:   77   Temp:   98.1 F (36.7 C)   TempSrc:   Oral   Resp:   20   Height:      Weight:      SpO2: 95% 97% 91% 93%    Wt Readings from Last 3 Encounters:  04/15/11 100.245 kg (221 lb)     Intake/Output Summary (Last 24 hours) at 04/17/11 0956 Last data filed at 04/17/11 0517  Gross per 24 hour  Intake   1350 ml  Output      0 ml  Net   1350 ml    Exam Awake Alert, Oriented *3, No new F.N deficits, Normal affect [place].AT,PERRAL Supple Neck,No JVD, No cervical lymphadenopathy appriciated.  Symmetrical Chest wall movement, Good air movement bilaterally, few wheezes and rales RRR,No Gallops,Rubs or new Murmurs, No Parasternal Heave +ve B.Sounds, Abd Soft, Non tender, No organomegaly appriciated, No rebound -guarding or rigidity. No Cyanosis, Clubbing or edema, No new Rash or bruise     Data Review  CBC  Lab 04/17/11 0430 04/16/11 0440 04/15/11 1755  WBC 9.5 16.2* 14.7*  HGB 13.3 13.9 15.5  HCT 37.5* 39.2 43.0  PLT 222 221 223  MCV 91.7 92.0 92.7  MCH 32.5 32.6 33.4  MCHC 35.5 35.5 36.0  RDW 12.6 12.6 12.9  LYMPHSABS -- -- 0.4*  MONOABS -- -- 1.3*  EOSABS -- -- 0.0  BASOSABS -- -- 0.0  BANDABS -- -- --    Chemistries   Lab 04/17/11 0430 04/16/11 0440 04/15/11 1755  NA 128* 127* 126*  K 4.3 4.4 4.4  CL 95* 93* 92*  CO2 20 22 21   GLUCOSE 147* 169* 155*  BUN 16 11 12   CREATININE 0.75 0.80 0.84  CALCIUM 8.7 8.8 9.2  MG 2.1 -- --  AST -- -- --  ALT -- -- --    ALKPHOS -- -- --  BILITOT -- -- --   ------------------------------------------------------------------------------------------------------------------ estimated creatinine clearance is 103.1 ml/min (by C-G formula based on Cr of 0.75). ------------------------------------------------------------------------------------------------------------------  [facility] 04/16/11 1433  HGBA1C 5.6   ------------------------------------------------------------------------------------------------------------------ No results found for this basename: CHOL:2,HDL:2,LDLCALC:2,TRIG:2,CHOLHDL:2,LDLDIRECT:2 in the last 72 hours ------------------------------------------------------------------------------------------------------------------  [facility] 04/16/11 1433 04/15/11 1755  TSH -- 0.244*  T4TOTAL -- --  T3FREE 2.2* --  THYROIDAB -- --   ------------------------------------------------------------------------------------------------------------------ No results found for this basename: VITAMINB12:2,FOLATE:2,FERRITIN:2,TIBC:2,IRON:2,RETICCTPCT:2 in the last 72 hours  Coagulation profile No results found for this basename: INR:5,PROTIME:5 in the last 168 hours  No results found for this basename: DDIMER:2 in the last 72 hours  Cardiac Enzymes No results found for this basename: CK:3,CKMB:3,TROPONINI:3,MYOGLOBIN:3 in the last 168 hours ------------------------------------------------------------------------------------------------------------------ No components found with this basename: POCBNP:3  Micro Results Recent Results (from the past 240 hour(s))  CULTURE, BLOOD (ROUTINE X 2)     Status: Normal (Preliminary result)   Collection Time   04/15/11  5:55 PM      Component Value Range  Status Comment   Specimen Description BLOOD BLOOD RIGHT WRIST   Final    Special Requests BOTTLES DRAWN AEROBIC AND ANAEROBIC 5 CC EA   Final    Setup Time [id number]   Final    Culture     Final    Value:         BLOOD CULTURE RECEIVED NO GROWTH TO DATE CULTURE WILL BE HELD FOR 5 DAYS BEFORE ISSUING A FINAL NEGATIVE REPORT   Report Status PENDING   Incomplete   CULTURE, BLOOD (ROUTINE X 2)     Status: Normal (Preliminary result)   Collection Time   04/15/11  6:05 PM      Component Value Range Status Comment   Specimen Description BLOOD RIGHT ANTECUBITAL   Final    Special Requests BOTTLES DRAWN AEROBIC AND ANAEROBIC 5 CC EA   Final    Setup Time [id number]   Final    Culture     Final    Value:        BLOOD CULTURE RECEIVED NO GROWTH TO DATE CULTURE WILL BE HELD FOR 5 DAYS BEFORE ISSUING A FINAL NEGATIVE REPORT   Report Status PENDING   Incomplete   CULTURE, RESPIRATORY     Status: Normal (Preliminary result)   Collection Time   04/16/11  4:16 PM      Component Value Range Status Comment   Specimen Description SPUTUM   Final    Special Requests NONE   Final    Gram Stain     Final    Value: NO WBC SEEN     RARE SQUAMOUS EPITHELIAL CELLS PRESENT     RARE GRAM POSITIVE RODS     RARE GRAM NEGATIVE RODS   Culture Culture reincubated for better growth   Final    Report Status PENDING   Incomplete     Radiology Reports Dg Chest 2 View  04/15/2011  *RADIOLOGY REPORT*  Clinical Data: Shortness of breath.  Cough and fever.  History of smoking.  CHEST - 2 VIEW  Comparison: None.  Findings: The heart size and mediastinal contours are normal.  The lungs demonstrate diffuse central airway thickening without hyperinflation or confluent airspace opacity.  There is no pleural effusion. The patient is status post lower cervical fusion and apparent distal left clavicle resection.  IMPRESSION: Chronic bronchitis.  No acute cardiopulmonary process identified.  Original Report Authenticated By: Gerrianne Scale, M.D.    Scheduled Meds:    . albuterol  2.5 mg Nebulization TID  . aspirin  325 mg Oral Daily  . enoxaparin (LOVENOX) injection  40 mg Subcutaneous Q24H  . glimepiride  2 mg Oral QAC breakfast   . insulin aspart  0-15 Units Subcutaneous TID WC  . insulin aspart  0-5 Units Subcutaneous QHS  . ipratropium  0.5 mg Nebulization TID  . levofloxacin (LEVAQUIN) IV  500 mg Intravenous Q24H  . lisinopril  20 mg Oral Daily  . metFORMIN  1,000 mg Oral BID WC  . nicotine  14 mg Transdermal Daily  . oseltamivir  75 mg Oral BID  . PHENObarbital  97.2 mg Oral Daily  . prednisoLONE  40 mg Oral Daily  . simvastatin  20 mg Oral q1800  . DISCONTD: enoxaparin  40 mg Subcutaneous QHS  . DISCONTD: furosemide  20 mg Intravenous Once  . DISCONTD: ibuprofen  400 mg Oral QID  . DISCONTD: methylPREDNISolone (SOLU-MEDROL) injection  40 mg Intravenous Q12H   Continuous Infusions:    . sodium  chloride 100 mL/hr at 04/16/11 1730  . sodium chloride     PRN Meds:.albuterol, alum & mag hydroxide-simeth, benzonatate, HYDROcodone-acetaminophen, ipratropium, morphine, ondansetron (ZOFRAN) IV, ondansetron, zolpidem  Assessment & Plan   1. SIRS due to Infl A URI + COPD exacerbation - D/W Keith Wise PCCM, looking at the clinical picture despite the timeframe will challenge Keith Wise with tamiflu, change to PO steroids , continue Levaquin, o2-nebs, Pox monitor.Few rales so check BNP and Echo, overall much better.   2.Tobacco abuse - counseled, Nic patch.   3. DM-2 - stable A1c, home Meds + ISS   Lab Results  Component Value Date   HGBA1C 5.6 04/16/2011    4. H/O  Hypertension &  Hyperlipidemia - no acute issues continue on statin, monitor BP   5. Hyponatremia - Ur na <40, improved with IVF continue gentle IVF .   6. Mildly Low TSH with Slightly Low free T3-  NML free t4 - repeat studies in 1 week and outpt Endo follow  Primary pituitary problem, D/W Keith Wise - follow in 1 week MD office will call and set up appointment.   Results for CORBETT, MOULDER (MRN [id number]) as of 04/17/2011 09:53  Ref. Range 04/15/2011 17:55 04/16/2011 14:33  TSH Latest Range: 0.350-4.500 uIU/mL 0.244 (L)   Free  T4 Latest Range: 0.80-1.80 ng/dL  4.09  T3, Free Latest Range: 2.3-4.2 pg/mL  2.2 (L)     7. Weakness falls due to #1, PT-OT.Improved.    DVT Prophylaxis  Lovenox    See Wise Orders from today for further details     Keith Wise M.D on 04/17/2011 at 9:56 AM  [facility] Office  [phone]

## 2011-04-17 NOTE — Progress Notes (Signed)
95% on RA when ambulating in hallway.

## 2011-04-17 NOTE — Progress Notes (Signed)
  Echocardiogram 2D Echocardiogram has been performed.  Keith Wise [facility] 04/17/2011, 11:27 AM

## 2011-04-18 LAB — BASIC METABOLIC PANEL
BUN: 16 mg/dL (ref 6–23)
CO2: 21 mEq/L (ref 19–32)
Chloride: 104 mEq/L (ref 96–112)
Glucose, Bld: 77 mg/dL (ref 70–99)
Potassium: 4.2 mEq/L (ref 3.5–5.1)
Sodium: 134 mEq/L — ABNORMAL LOW (ref 135–145)

## 2011-04-18 LAB — GLUCOSE, CAPILLARY
Glucose-Capillary: 95 mg/dL (ref 70–99)
Glucose-Capillary: 130 mg/dL — ABNORMAL HIGH (ref 70–99)

## 2011-04-18 MED ORDER — FLUTICASONE-SALMETEROL 250-50 MCG/DOSE IN AEPB
1.0000 | INHALATION_SPRAY | Freq: Two times a day (BID) | RESPIRATORY_TRACT | Status: DC
  Administered 2011-04-18 – 2011-04-19 (×2): 1 via RESPIRATORY_TRACT
  Filled 2011-04-18: qty 14

## 2011-04-18 MED ORDER — TIOTROPIUM BROMIDE MONOHYDRATE 18 MCG IN CAPS
18.0000 ug | ORAL_CAPSULE | Freq: Every day | RESPIRATORY_TRACT | Status: DC
  Filled 2011-04-18: qty 5

## 2011-04-18 NOTE — Progress Notes (Signed)
Keith Wise:[id number],WJX:[id number] is a 71 y.o. male,  Outpatient Primary MD for the patient is No primary provider on file.  Chief Complaint  Patient presents with  . Generalized Body Aches  . Fever        Subjective:   States are cough and breathing are better, denies chest pain. Chart reviewed.  Objective:   Filed Vitals:   04/18/11 0518 04/18/11 0902 04/18/11 1310 04/18/11 1416  BP: 111/65  155/77   Pulse: 68  74   Temp: 97.7 F (36.5 C)  97.8 F (36.6 C)   TempSrc: Oral  Oral   Resp: 18  18   Height:      Weight:      SpO2: 95% 92% 97% 95%    Wt Readings from Last 3 Encounters:  04/15/11 100.245 kg (221 lb)     Intake/Output Summary (Last 24 hours) at 04/18/11 1552 Last data filed at 04/18/11 1030  Gross per 24 hour  Intake   2430 ml  Output    725 ml  Net   1705 ml    Exam In general: Awake Alert, Oriented *3, No new F.N deficits, Normal affect HEENT: [place].AT,PERRAL Neck: Supple Neck,No JVD, No cervical lymphadenopathy appriciated.  Lungs: Symmetrical Chest wall movement, moderate air movement bilaterally, decreased wheezes, no crackles Cardiac: RRR,No Gallops,Rubs or new Murmurs, No Parasternal Heave Abdomen:  +ve B.Sounds, Abd Soft, Non tender, No organomegaly appriciated, No rebound -guarding or rigidity. Extremities: No Cyanosis, Clubbing or edema     Data Review  CBC  Lab 04/17/11 0430 04/16/11 0440 04/15/11 1755  WBC 9.5 16.2* 14.7*  HGB 13.3 13.9 15.5  HCT 37.5* 39.2 43.0  PLT 222 221 223  MCV 91.7 92.0 92.7  MCH 32.5 32.6 33.4  MCHC 35.5 35.5 36.0  RDW 12.6 12.6 12.9  LYMPHSABS -- -- 0.4*  MONOABS -- -- 1.3*  EOSABS -- -- 0.0  BASOSABS -- -- 0.0  BANDABS -- -- --    Chemistries   Lab 04/18/11 0505 04/17/11 0430 04/16/11 0440 04/15/11 1755  NA 134* 128* 127* 126*  K 4.2 4.3 4.4 4.4  CL 104 95* 93* 92*  CO2 21 20 22 21   GLUCOSE 77 147* 169* 155*  BUN 16 16 11 12   CREATININE 0.78 0.75 0.80 0.84  CALCIUM 9.1 8.7 8.8 9.2  MG  1.9 2.1 -- --  AST -- -- -- --  ALT -- -- -- --  ALKPHOS -- -- -- --  BILITOT -- -- -- --   ------------------------------------------------------------------------------------------------------------------ estimated creatinine clearance is 103.1 ml/min (by C-G formula based on Cr of 0.78). ------------------------------------------------------------------------------------------------------------------  [facility] 04/16/11 1433  HGBA1C 5.6   ------------------------------------------------------------------------------------------------------------------ No results found for this basename: CHOL:2,HDL:2,LDLCALC:2,TRIG:2,CHOLHDL:2,LDLDIRECT:2 in the last 72 hours ------------------------------------------------------------------------------------------------------------------  [facility] 04/16/11 1433 04/15/11 1755  TSH -- 0.244*  T4TOTAL -- --  T3FREE 2.2* --  THYROIDAB -- --   ------------------------------------------------------------------------------------------------------------------ No results found for this basename: VITAMINB12:2,FOLATE:2,FERRITIN:2,TIBC:2,IRON:2,RETICCTPCT:2 in the last 72 hours  Coagulation profile No results found for this basename: INR:5,PROTIME:5 in the last 168 hours  No results found for this basename: DDIMER:2 in the last 72 hours  Cardiac Enzymes No results found for this basename: CK:3,CKMB:3,TROPONINI:3,MYOGLOBIN:3 in the last 168 hours ------------------------------------------------------------------------------------------------------------------ No components found with this basename: POCBNP:3  Micro Results Recent Results (from the past 240 hour(s))  CULTURE, BLOOD (ROUTINE X 2)     Status: Normal (Preliminary result)   Collection Time   04/15/11  5:55 PM      Component  Value Range Status Comment   Specimen Description BLOOD BLOOD RIGHT WRIST   Final    Special Requests BOTTLES DRAWN AEROBIC AND ANAEROBIC 5 CC EA   Final    Setup Time  [id number]   Final    Culture     Final    Value:        BLOOD CULTURE RECEIVED NO GROWTH TO DATE CULTURE WILL BE HELD FOR 5 DAYS BEFORE ISSUING A FINAL NEGATIVE REPORT   Report Status PENDING   Incomplete   CULTURE, BLOOD (ROUTINE X 2)     Status: Normal (Preliminary result)   Collection Time   04/15/11  6:05 PM      Component Value Range Status Comment   Specimen Description BLOOD RIGHT ANTECUBITAL   Final    Special Requests BOTTLES DRAWN AEROBIC AND ANAEROBIC 5 CC EA   Final    Setup Time [id number]   Final    Culture     Final    Value:        BLOOD CULTURE RECEIVED NO GROWTH TO DATE CULTURE WILL BE HELD FOR 5 DAYS BEFORE ISSUING A FINAL NEGATIVE REPORT   Report Status PENDING   Incomplete   CULTURE, RESPIRATORY     Status: Normal (Preliminary result)   Collection Time   04/16/11  4:16 PM      Component Value Range Status Comment   Specimen Description SPUTUM   Final    Special Requests NONE   Final    Gram Stain     Final    Value: NO WBC SEEN     RARE SQUAMOUS EPITHELIAL CELLS PRESENT     RARE GRAM POSITIVE RODS     RARE GRAM NEGATIVE RODS   Culture NORMAL OROPHARYNGEAL FLORA   Final    Report Status PENDING   Incomplete     Radiology Reports Dg Chest 2 View  04/15/2011  *RADIOLOGY REPORT*  Clinical Data: Shortness of breath.  Cough and fever.  History of smoking.  CHEST - 2 VIEW  Comparison: None.  Findings: The heart size and mediastinal contours are normal.  The lungs demonstrate diffuse central airway thickening without hyperinflation or confluent airspace opacity.  There is no pleural effusion. The patient is status post lower cervical fusion and apparent distal left clavicle resection.  IMPRESSION: Chronic bronchitis.  No acute cardiopulmonary process identified.  Original Report Authenticated By: Gerrianne Scale, M.D.    Scheduled Meds:    . albuterol  2.5 mg Nebulization TID  . aspirin  325 mg Oral Daily  . enoxaparin (LOVENOX) injection  40 mg Subcutaneous  Q24H  . glimepiride  2 mg Oral QAC breakfast  . insulin aspart  0-15 Units Subcutaneous TID WC  . insulin aspart  0-5 Units Subcutaneous QHS  . ipratropium  0.5 mg Nebulization TID  . levofloxacin (LEVAQUIN) IV  500 mg Intravenous Q24H  . lisinopril  20 mg Oral Daily  . metFORMIN  1,000 mg Oral BID WC  . nicotine  14 mg Transdermal Daily  . oseltamivir  75 mg Oral BID  . PHENObarbital  97.2 mg Oral Daily  . predniSONE  40 mg Oral Q breakfast  . simvastatin  20 mg Oral q1800   Continuous Infusions:    . sodium chloride 75 mL/hr at 04/17/11 1000   PRN Meds:.albuterol, alum & mag hydroxide-simeth, benzonatate, HYDROcodone-acetaminophen, ipratropium, morphine, ondansetron (ZOFRAN) IV, ondansetron, zolpidem  Assessment & Plan   1.  Infl A URI  -Continue Tamiflu,  Improving.  2.COPD exacerbation add-clinically improving, will start Spiriva and Advair, nebs when necessary and follow. Also check O2 sats on room air in a.m. if continues to improve plan DC in a.m.  3.SIRS-clinically improved, secondary to above  4.Tobacco abuse - counseled, Nic patch.   3. DM-2 - stable A1c, home Meds + ISS   Lab Results  Component Value Date   HGBA1C 5.6 04/16/2011    4. H/O  Hypertension &  Hyperlipidemia - no acute issues continue on statin, monitor BP   5. Hyponatremia - Ur na <40, improved with IVF continue gentle IVF .   6. Mildly Low TSH with Slightly Low free T3-  NML free t4 - repeat studies in 1 week and outpt Endo follow  Primary pituitary problem, D/W [place] [facility]o Dr Everardo All - follow in 1 week MD office will call and set up appointment.   Results for SHOLOM, DULUDE (MRN [id number]) as of 04/17/2011 09:53  Ref. Range 04/15/2011 17:55 04/16/2011 14:33  TSH Latest Range: 0.350-4.500 uIU/mL 0.244 (L)   Free T4 Latest Range: 0.80-1.80 ng/dL  2.95  T3, Free Latest Range: 2.3-4.2 pg/mL  2.2 (L)     7. Weakness falls due to #1, PT-OT.Improved.    DVT Prophylaxis  Lovenox    See all  Orders from today for further details     Joesiah Lonon C M.D on 04/18/2011 at 3:52 PM  [facility] Office  [phone]

## 2011-04-18 NOTE — ED Provider Notes (Signed)
Medical screening examination/treatment/procedure(s) were conducted as a shared visit with non-physician practitioner(s) and myself.  I personally evaluated the patient during the encounter  Shelda Jakes, MD 04/18/11 2049

## 2011-04-19 LAB — BASIC METABOLIC PANEL
Calcium: 9.5 mg/dL (ref 8.4–10.5)
Chloride: 104 mEq/L (ref 96–112)
Creatinine, Ser: 0.78 mg/dL (ref 0.50–1.35)
GFR calc Af Amer: >90 mL/min (ref >90)

## 2011-04-19 LAB — GLUCOSE, CAPILLARY: Glucose-Capillary: 97 mg/dL (ref 70–99)

## 2011-04-19 LAB — MAGNESIUM: Magnesium: 1.8 mg/dL (ref 1.5–2.5)

## 2011-04-19 MED ORDER — FLUTICASONE-SALMETEROL 250-50 MCG/DOSE IN AEPB: 1.0000 | INHALATION_SPRAY | Freq: Two times a day (BID) | RESPIRATORY_TRACT | Status: DC

## 2011-04-19 MED ORDER — TIOTROPIUM BROMIDE MONOHYDRATE 18 MCG IN CAPS: 18.0000 ug | ORAL_CAPSULE | Freq: Every day | RESPIRATORY_TRACT | Status: DC

## 2011-04-19 MED ORDER — PREDNISONE 20 MG PO TABS: ORAL_TABLET | ORAL | Status: DC

## 2011-04-19 MED ORDER — OSELTAMIVIR PHOSPHATE 75 MG PO CAPS: 75.0000 mg | ORAL_CAPSULE | Freq: Two times a day (BID) | ORAL | Status: AC

## 2011-04-19 MED ORDER — ALBUTEROL 90 MCG/ACT IN AERS: 2.0000 | INHALATION_SPRAY | RESPIRATORY_TRACT | Status: DC | PRN

## 2011-04-19 MED ORDER — LEVOFLOXACIN 500 MG PO TABS: 500.0000 mg | ORAL_TABLET | Freq: Every day | ORAL | Status: AC

## 2011-04-19 MED ORDER — BENZONATATE 100 MG PO CAPS: 100.0000 mg | ORAL_CAPSULE | Freq: Three times a day (TID) | ORAL | Status: AC | PRN

## 2011-04-19 NOTE — Progress Notes (Signed)
CBG: 67 at 2152 1/23  Treatment: snack (crackers, peanut butter, juice)  Symptoms: none  Follow-up CBG: Time:0007 CBG Result:118 1/24  Possible Reasons for Event: unknown  Comments/MD notified: MD not notified. Event managed per protocol.     Aretha Parrot

## 2011-04-19 NOTE — Discharge Summary (Signed)
Discharge Note  Name: Keith Wise MRN: [id number] DOB: 1940-06-27 71 y.o.  Date of Admission: 04/15/2011  5:58 PM Date of Discharge: 04/19/2011 Attending Physician: Kela Millin, MD  Discharge Diagnosis: Principal Problem:  *SIRS (systemic inflammatory response syndrome) Active Problems:  COPD with acute exacerbation  Tobacco abuse  Diabetes mellitus  Hypertension  Hyperlipidemia  influenza A Abnormal thyroid function tests- outpatient with Dr. Everardo All  Discharge Medications: Medication List  As of 04/19/2011  3:16 PM   TAKE these medications         albuterol 90 MCG/ACT inhaler   Commonly known as: PROVENTIL,VENTOLIN   Inhale 2 puffs into the lungs every 4 (four) hours as needed for wheezing.      aspirin 325 MG EC tablet   Take 325 mg by mouth daily.      benzonatate 100 MG capsule   Commonly known as: TESSALON   Take 1 capsule (100 mg total) by mouth 3 (three) times daily as needed for cough.      Fluticasone-Salmeterol 250-50 MCG/DOSE Aepb   Commonly known as: ADVAIR   Inhale 1 puff into the lungs 2 (two) times daily.      glimepiride 2 MG tablet   Commonly known as: AMARYL   Take 2 mg by mouth daily before breakfast.      levofloxacin 500 MG tablet   Commonly known as: LEVAQUIN   Take 1 tablet (500 mg total) by mouth daily.      lisinopril 20 MG tablet   Commonly known as: PRINIVIL,ZESTRIL   Take 20 mg by mouth daily.      metFORMIN 1000 MG tablet   Commonly known as: GLUCOPHAGE   Take 1,000 mg by mouth 2 (two) times daily with a meal.      oseltamivir 75 MG capsule   Commonly known as: TAMIFLU   Take 1 capsule (75 mg total) by mouth 2 (two) times daily.      PHENObarbital 100 MG tablet   Commonly known as: LUMINAL   Take 100 mg by mouth daily.      pravastatin 40 MG tablet   Commonly known as: PRAVACHOL   Take 40 mg by mouth daily.      predniSONE 20 MG tablet   Commonly known as: DELTASONE   Take 2 tablets daily for 3 days, then 1 tablet  daily for 3 days, then half a tablet daily for 3 days and then stop.  Take With food      tiotropium 18 MCG inhalation capsule   Commonly known as: SPIRIVA   Place 1 capsule (18 mcg total) into inhaler and inhale daily.            Disposition and follow-up:   Keith Wise was discharged from [facility] in improved/stable condition.    Follow-up Appointments: Discharge Orders    Future Appointments: Provider: Department: Dept Phone: Center:   04/30/2011 9:30 AM Romero Belling, MD [facility] [phone] [facility]     Future Orders Please Complete By Expires   Diet - low sodium heart healthy      Diet Carb Modified      Increase activity slowly      Call MD for:  temperature >100.4         Consultations:    Procedures Performed:  Dg Chest 2 View  04/15/2011  *RADIOLOGY REPORT*  Clinical Data: Shortness of breath.  Cough and fever.  History of smoking.  CHEST - 2 VIEW  Comparison:  None.  Findings: The heart size and mediastinal contours are normal.  The lungs demonstrate diffuse central airway thickening without hyperinflation or confluent airspace opacity.  There is no pleural effusion. The patient is status post lower cervical fusion and apparent distal left clavicle resection.  IMPRESSION: Chronic bronchitis.  No acute cardiopulmonary process identified.  Original Report Authenticated By: Gerrianne Scale, M.D.   2D echocardiogram Study Conclusions  - Left ventricle: There was mild concentric hypertrophy. Systolic function was vigorous. The estimated ejection fraction was in the range of 65% to 70%. Wall motion was normal; there were no regional wall motion abnormalities. Doppler parameters are consistent with abnormal left ventricular relaxation (grade 1 diastolic dysfunction). - Right ventricle: The cavity size was mildly dilated. Transthoracic echocardiography. M-mode, complete 2D, spectral Doppler, and color Doppler. Height: Height: 175.3cm. Height:  69in. Weight: Weight: 100.2kg. Weight: 220.5lb. Body mass index: BMI: 32.6kg/m^2. Body surface area: BSA: 2.108m^2. Blood pressure: 103/56. Patient status: Inpatient. Location: Bedside.   brief history This is a 71 year old gentleman who states he's had a cold since Thursday. He states he's been having shortness of breath and wheezing, he's had a persistent nonproductive cough, with a low-grade fever. No nausea no vomiting no diarrhea. He's been getting progressively weaker, lightheaded and dizzy. He fell 2 days ago, twice yesterday and twice today. He states he has developed a right hip, that started several months ago and has been becoming gradually worse. He additionally has a leg, thigh and calf cramps, which is chronic but has been worse since feeling ill. He states his falls are mechanical. he has no loss of consciousness, no chest pains, no palpitations. He states the heating blanket helps his hip pain as well as the cramps. He came to the ER today because of his increasing fall. He states his wife is also ill but she is getting better. The patient does smoke daily. History provided by the patient was alert and oriented. In the ER the patient was found to have a temperature 102.4.  Physical exam In general: Awake Alert, Oriented *3, No new F.N deficits, Normal affect  HEENT: [place].AT,PERRAL  Neck: Supple Neck,No JVD, No cervical lymphadenopathy appriciated.  Lungs: Symmetrical Chest wall movement, moderate air movement bilaterally, decreased wheezes, no crackles  Cardiac: RRR,No Gallops,Rubs or new Murmurs, No Parasternal Heave  Abdomen: +ve B.Sounds, Abd Soft, Non tender, No organomegaly appriciated, No rebound -guarding or rigidity.  Extremities: No Cyanosis, Clubbing or edema   Hospital Course by problem list: Principal Problem:  *SIRS (systemic inflammatory response syndrome) Active Problems:  COPD with acute exacerbation  Tobacco abuse  Diabetes mellitus  Hypertension   Hyperlipidemia  influenza A. virus 1. influenza  A URI  -That patient had an influenza panel done on admission and it came back positive for influenza A, and patient was started on Tamiflu. He quickly responded to treatment and-defervesced, symptoms are improved. He is continued to do well and is remaining as above and hemodynamically stable. He'll be discharged  to complete a total five-day course of therapy Tamiflu and his to follow up with his PCP outpatient. 2.COPD exacerbation  -The impression was that it was precipitated by #1. He had a chest x-ray done and it came back showing chronic bronchitis, no acute infiltrates. A 2-D echocardiogram was also done and it came back with an EF of 65-70% wall motion normal no regional wall motion abnormalities,Grade 1 diastolic dysfunction noted. Patient was treated with nebulized bronchodilators, antibiotics as well as steroids and the Tamiflu  as above. The patient's symptoms improved his nebulizers were changed to MDIs and-Spiriva and a Advair along with when necessary albuterol. She is is there is were also changed to by mouth. He has continued to do well, His O2 sats on room air 93-94% today. He'll be discharged on all oral antibiotics, prednisone taper and bronchodilators. He was counseled to quit tobacco. 3.SIRS-secondary to above, blood and urine cultures were obtained and came back negative. With a treatment of above her leukocytosis and fevers resolved and she's remained hemodynamically stable. 4.Tobacco abuse - he was counseled to quit, was on Nic patch while in the hospital.  3. DM-2 - hemoglobin A1c was done while was in the hospital and came back at 5.6. He was maintained on his outpatient medications and is to follow up outpatient.  Lab Results   Component  Value  Date    HGBA1C  5.6  04/16/2011    4. H/O Hypertension & Hyperlipidemia -he was maintained on his outpatient medications are. 5. Hyponatremia - the impression was that this was secondary  to volume the patient on R., Ur na <40, resolved with IVF  6. abnormal thyroid function tests and-patient had TFTs done and they came back Mildly Low TSH with Slightly Low free T3- NML free t4 - repeat studies in 1 week and outpt Endo follow Primary pituitary problem, D/W [facility] Dr Everardo All - follow in 1 week MD office will call and set up appointment.  Results for Keith Wise, Keith Wise (MRN [id number]) as of 04/17/2011 09:53   Ref. Range  04/15/2011 17:55  04/16/2011 14:33   TSH  Latest Range: 0.350-4.500 uIU/mL  0.244 (L)    Free T4  Latest Range: 0.80-1.80 ng/dL   4.09   T3, Free  Latest Range: 2.3-4.2 pg/mL   2.2 (L)    7. Weakness falls due to #1,- patient was seen by PT-OT and no skilled  needs recommended.     Discharge Vitals:  BP 153/83  Pulse 78  Temp(Src) 98.1 F (36.7 C) (Oral)  Resp 20  Ht 5\' 9"  (1.753 m)  Wt 100.245 kg (221 lb)  BMI 32.64 kg/m2  SpO2 93%  Discharge Labs:  Results for orders placed during the hospital encounter of 04/15/11 (from the past 24 hour(s))  GLUCOSE, CAPILLARY     Status: Normal   Collection Time   04/18/11  6:05 PM      Component Value Range   Glucose-Capillary 91  70 - 99 (mg/dL)  GLUCOSE, CAPILLARY     Status: Abnormal   Collection Time   04/18/11  9:52 PM      Component Value Range   Glucose-Capillary 68 (*) 70 - 99 (mg/dL)   Comment 1 Documented in Chart     Comment 2 Notify RN    GLUCOSE, CAPILLARY     Status: Abnormal   Collection Time   04/19/11 12:07 AM      Component Value Range   Glucose-Capillary 118 (*) 70 - 99 (mg/dL)  BASIC METABOLIC PANEL     Status: Normal   Collection Time   04/19/11  4:55 AM      Component Value Range   Sodium 136  135 - 145 (mEq/L)   Potassium 4.2  3.5 - 5.1 (mEq/L)   Chloride 104  96 - 112 (mEq/L)   CO2 23  19 - 32 (mEq/L)   Glucose, Bld 74  70 - 99 (mg/dL)   BUN 11  6 - 23 (mg/dL)   Creatinine,  Ser 0.78  0.50 - 1.35 (mg/dL)   Calcium 9.5  8.4 - 56.2 (mg/dL)   GFR calc non Af Amer >90  >90  (mL/min)   GFR calc Af Amer >90  >90 (mL/min)  MAGNESIUM     Status: Normal   Collection Time   04/19/11  4:55 AM      Component Value Range   Magnesium 1.8  1.5 - 2.5 (mg/dL)  GLUCOSE, CAPILLARY     Status: Normal   Collection Time   04/19/11  8:09 AM      Component Value Range   Glucose-Capillary 88  70 - 99 (mg/dL)  GLUCOSE, CAPILLARY     Status: Normal   Collection Time   04/19/11 11:53 AM      Component Value Range   Glucose-Capillary 97  70 - 99 (mg/dL)    Signed: Kela Millin 04/19/2011, 3:16 PM

## 2011-04-22 LAB — CULTURE, BLOOD (ROUTINE X 2)
Culture  Setup Time: 201301210927
Culture: NO GROWTH

## 2011-04-30 ENCOUNTER — Ambulatory Visit (INDEPENDENT_AMBULATORY_CARE_PROVIDER_SITE_OTHER): Payer: 59 | Admitting: Endocrinology

## 2011-04-30 ENCOUNTER — Other Ambulatory Visit (INDEPENDENT_AMBULATORY_CARE_PROVIDER_SITE_OTHER): Payer: 59

## 2011-04-30 ENCOUNTER — Encounter: Payer: Self-pay | Admitting: Endocrinology

## 2011-04-30 VITALS — BP 134/68 | HR 60 | Temp 97.6°F | Ht 69.0 in | Wt 226.0 lb

## 2011-04-30 DIAGNOSIS — E079 Disorder of thyroid, unspecified: Secondary | ICD-10-CM | POA: Insufficient documentation

## 2011-04-30 LAB — TSH: TSH: 1.29 u[IU]/mL (ref 0.35–5.50)

## 2011-04-30 NOTE — Patient Instructions (Addendum)
Please see dr Jeannetta Nap soon, to follow-up your breathing and other health problems. blood tests are being requested for you today.  please call [phone] to hear your test results.  You will be prompted to enter the 9-digit "MRN" number that appears at the top left of this page, followed by #.  Then you will hear the message. Based on the results, there are 2 possible reasons for your abnormal blood test in the hospital.  One is that some people's thyroid blood tests are abnormal in the hospital, and then they return to normal when they feel better.  The other possibility is that you have a mildly overactive thyroid that was first noticed in the hospital.   (update: i left message on phone-tree:  i advise recheck tsh in 1-3 mos with dr Jeannetta Nap.  Ret here prn)

## 2011-04-30 NOTE — Progress Notes (Signed)
Subjective:    Patient ID: Keith Wise, male    DOB: 1941/03/13, 71 y.o.   MRN: [id number]  HPI Pt was recently in the hospital with copd exacerbation, and was noted to have abnormal TFT.  He is unaware of ever having had any thyroid problem before.  He feels much better since hosp d/c.  He has few years of moderate pain at the lower back , and assoc pain at the back of the neck.   Past Medical History  Diagnosis Date  . Diabetes mellitus   . Hypertension   . Hypercholesteremia     Past Surgical History  Procedure Date  . Orthopedic surgery     lumbar, cervical    History   Social History  . Marital Status: Married    Spouse Name: N/A    Number of Children: N/A  . Years of Education: N/A   Occupational History  . Not on file.   Social History Main Topics  . Smoking status: Current Everyday Smoker -- 2.0 packs/day for 55 years    Types: Cigarettes  . Smokeless tobacco: Not on file  . Alcohol Use: No  . Drug Use: No  . Sexually Active: Not on file   Other Topics Concern  . Not on file   Social History Narrative  . No narrative on file    Current Outpatient Prescriptions on File Prior to Visit  Medication Sig Dispense Refill  . albuterol (PROVENTIL,VENTOLIN) 90 MCG/ACT inhaler Inhale 2 puffs into the lungs every 4 (four) hours as needed for wheezing.  17 g  12  . aspirin 325 MG EC tablet Take 325 mg by mouth daily.      . Fluticasone-Salmeterol (ADVAIR) 250-50 MCG/DOSE AEPB Inhale 1 puff into the lungs 2 (two) times daily.  60 each  0  . glimepiride (AMARYL) 2 MG tablet Take 2 mg by mouth daily before breakfast.      . lisinopril (PRINIVIL,ZESTRIL) 20 MG tablet Take 20 mg by mouth daily.      . metFORMIN (GLUCOPHAGE) 1000 MG tablet Take 1,000 mg by mouth 2 (two) times daily with a meal.      . PHENObarbital (LUMINAL) 100 MG tablet Take 100 mg by mouth daily.      . pravastatin (PRAVACHOL) 40 MG tablet Take 40 mg by mouth daily.      Marland Kitchen tiotropium (SPIRIVA) 18 MCG  inhalation capsule Place 1 capsule (18 mcg total) into inhaler and inhale daily.  30 capsule  0  . levofloxacin (LEVAQUIN) 500 MG tablet Take 1 tablet (500 mg total) by mouth daily.  2 tablet  o  . oseltamivir (TAMIFLU) 75 MG capsule Take 1 capsule (75 mg total) by mouth 2 (two) times daily.  6 capsule  0  . predniSONE (DELTASONE) 20 MG tablet Take 2 tablets daily for 3 days, then 1 tablet daily for 3 days, then half a tablet daily for 3 days and then stop. Take With food  11 tablet  0    No Known Allergies  Family History  Problem Relation Age of Onset  . Diabetes type II    no thyroid probs  BP 134/68  Pulse 60  Temp(Src) 97.6 F (36.4 C) (Oral)  Ht 5\' 9"  (1.753 m)  Wt 226 lb (102.513 kg)  BMI 33.37 kg/m2  SpO2 97%      Review of Systems denies headache, hoarseness, double vision, palpitations, diarrhea, polyuria, myalgias, cough, excessive diaphoresis, numbness, tremor, anxiety, hypoglycemia, bruising, and rhinorrhea.  He has lost weight over the past 6 mos, due to his efforts.     Objective:   Physical Exam VS: see vs page GEN: no distress HEAD: head: no deformity eyes: no periorbital swelling, no proptosis external nose and ears are normal mouth: no lesion seen NECK: supple, thyroid is not enlarged.  Neck: a healed scar is present (c-spine surgery) CHEST WALL: no deformity LUNGS: clear to auscultation BREASTS:  No gynecomastia CV: reg rate and rhythm, no murmur ABD: abdomen is soft, nontender.  no hepatosplenomegaly.  not distended.  There is a small self-reducing midline ventral hernia MUSCULOSKELETAL: muscle bulk and strength are grossly normal.  no obvious joint swelling.  gait is normal and steady EXTEMITIES: no deformity of the hands.  Trace bilat leg edema PULSES: dorsalis pedis intact bilat.  no carotid bruit NEURO:  cn 2-12 grossly intact.   readily moves all 4's.  There is a slight fine tremor of the hands SKIN:  Normal texture and temperature.  No rash  or suspicious lesion is visible.  Not diaphoretic.   NODES:  None palpable at the neck PSYCH: alert, oriented x3.  Does not appear anxious nor depressed.  Lab Results  Component Value Date   TSH 1.29 04/30/2011      Assessment & Plan:  Abnormal tsh, uncertain etiology, resolved.  However, pt has risk of developing thyroid dysfunction in the future. Abnormal T3, usually related to acute illness.  No f/u of this is needed Neck pain not thyroid-related Weight-loss, not thyroid-related

## 2012-03-21 ENCOUNTER — Other Ambulatory Visit: Payer: Self-pay | Admitting: Family Medicine

## 2012-03-21 DIAGNOSIS — R109 Unspecified abdominal pain: Secondary | ICD-10-CM

## 2012-03-25 ENCOUNTER — Ambulatory Visit
Admission: RE | Admit: 2012-03-25 | Discharge: 2012-03-25 | Disposition: A | Discharge status: Home / Self Care | Payer: 59 | Source: Ambulatory Visit | Attending: Family Medicine | Admitting: Family Medicine

## 2012-03-25 DIAGNOSIS — R109 Unspecified abdominal pain: Secondary | ICD-10-CM

## 2013-10-31 ENCOUNTER — Inpatient Hospital Stay (HOSPITAL_COMMUNITY)
Admission: EM | Admit: 2013-10-31 | Discharge: 2013-11-04 | DRG: 470 | Disposition: A | Discharge status: Skilled Nursing Facility | Payer: 59 | Attending: Internal Medicine | Admitting: Internal Medicine

## 2013-10-31 ENCOUNTER — Inpatient Hospital Stay (HOSPITAL_COMMUNITY): Payer: 59

## 2013-10-31 ENCOUNTER — Emergency Department (HOSPITAL_COMMUNITY): Payer: 59

## 2013-10-31 DIAGNOSIS — S72002A Fracture of unspecified part of neck of left femur, initial encounter for closed fracture: Secondary | ICD-10-CM

## 2013-10-31 DIAGNOSIS — S72033A Displaced midcervical fracture of unspecified femur, initial encounter for closed fracture: Principal | ICD-10-CM | POA: Diagnosis present

## 2013-10-31 DIAGNOSIS — G8918 Other acute postprocedural pain: Secondary | ICD-10-CM | POA: Diagnosis not present

## 2013-10-31 DIAGNOSIS — E7800 Pure hypercholesterolemia, unspecified: Secondary | ICD-10-CM | POA: Diagnosis present

## 2013-10-31 DIAGNOSIS — W19XXXA Unspecified fall, initial encounter: Secondary | ICD-10-CM | POA: Diagnosis present

## 2013-10-31 DIAGNOSIS — Z7982 Long term (current) use of aspirin: Secondary | ICD-10-CM

## 2013-10-31 DIAGNOSIS — E669 Obesity, unspecified: Secondary | ICD-10-CM | POA: Diagnosis present

## 2013-10-31 DIAGNOSIS — F17200 Nicotine dependence, unspecified, uncomplicated: Secondary | ICD-10-CM | POA: Diagnosis present

## 2013-10-31 DIAGNOSIS — Z791 Long term (current) use of non-steroidal anti-inflammatories (NSAID): Secondary | ICD-10-CM

## 2013-10-31 DIAGNOSIS — E785 Hyperlipidemia, unspecified: Secondary | ICD-10-CM | POA: Diagnosis present

## 2013-10-31 DIAGNOSIS — Z79899 Other long term (current) drug therapy: Secondary | ICD-10-CM

## 2013-10-31 DIAGNOSIS — I5032 Chronic diastolic (congestive) heart failure: Secondary | ICD-10-CM | POA: Diagnosis present

## 2013-10-31 DIAGNOSIS — Z6832 Body mass index (BMI) 32.0-32.9, adult: Secondary | ICD-10-CM | POA: Diagnosis not present

## 2013-10-31 DIAGNOSIS — Y9361 Activity, american tackle football: Secondary | ICD-10-CM | POA: Diagnosis not present

## 2013-10-31 DIAGNOSIS — G40909 Epilepsy, unspecified, not intractable, without status epilepticus: Secondary | ICD-10-CM | POA: Diagnosis present

## 2013-10-31 DIAGNOSIS — Z833 Family history of diabetes mellitus: Secondary | ICD-10-CM | POA: Diagnosis not present

## 2013-10-31 DIAGNOSIS — J449 Chronic obstructive pulmonary disease, unspecified: Secondary | ICD-10-CM | POA: Diagnosis present

## 2013-10-31 DIAGNOSIS — E871 Hypo-osmolality and hyponatremia: Secondary | ICD-10-CM | POA: Diagnosis present

## 2013-10-31 DIAGNOSIS — E119 Type 2 diabetes mellitus without complications: Secondary | ICD-10-CM | POA: Diagnosis present

## 2013-10-31 DIAGNOSIS — M25559 Pain in unspecified hip: Secondary | ICD-10-CM | POA: Diagnosis present

## 2013-10-31 DIAGNOSIS — I10 Essential (primary) hypertension: Secondary | ICD-10-CM | POA: Diagnosis present

## 2013-10-31 DIAGNOSIS — J4489 Other specified chronic obstructive pulmonary disease: Secondary | ICD-10-CM | POA: Diagnosis present

## 2013-10-31 DIAGNOSIS — Z720 Tobacco use: Secondary | ICD-10-CM

## 2013-10-31 DIAGNOSIS — D62 Acute posthemorrhagic anemia: Secondary | ICD-10-CM | POA: Diagnosis not present

## 2013-10-31 DIAGNOSIS — I509 Heart failure, unspecified: Secondary | ICD-10-CM | POA: Diagnosis present

## 2013-10-31 DIAGNOSIS — D72829 Elevated white blood cell count, unspecified: Secondary | ICD-10-CM | POA: Diagnosis present

## 2013-10-31 DIAGNOSIS — S72009A Fracture of unspecified part of neck of unspecified femur, initial encounter for closed fracture: Secondary | ICD-10-CM

## 2013-10-31 HISTORY — DX: Unspecified convulsions: R56.9

## 2013-10-31 HISTORY — DX: Chronic obstructive pulmonary disease, unspecified: J44.9

## 2013-10-31 HISTORY — DX: Shortness of breath: R06.02

## 2013-10-31 LAB — BASIC METABOLIC PANEL
Anion gap: 15 (ref 5–15)
BUN: 8 mg/dL (ref 6–23)
CALCIUM: 9 mg/dL (ref 8.4–10.5)
CHLORIDE: 100 meq/L (ref 96–112)
CO2: 21 mEq/L (ref 19–32)
Creatinine, Ser: 0.71 mg/dL (ref 0.50–1.35)
GFR calc Af Amer: >90 mL/min (ref >90)
GFR calc non Af Amer: >90 mL/min (ref >90)
GLUCOSE: 118 mg/dL — AB (ref 70–99)
Potassium: 4.6 mEq/L (ref 3.7–5.3)
Sodium: 136 mEq/L — ABNORMAL LOW (ref 137–147)

## 2013-10-31 LAB — CBC WITH DIFFERENTIAL/PLATELET
Basophils Absolute: 0 10*3/uL (ref 0.0–0.1)
Basophils Relative: 0 % (ref 0–1)
EOS PCT: 1 % (ref 0–5)
Eosinophils Absolute: 0.1 10*3/uL (ref 0.0–0.7)
HEMATOCRIT: 40.3 % (ref 39.0–52.0)
Hemoglobin: 14.1 g/dL (ref 13.0–17.0)
LYMPHS PCT: 10 % — AB (ref 12–46)
Lymphs Abs: 1.5 10*3/uL (ref 0.7–4.0)
MCH: 34 pg (ref 26.0–34.0)
MCHC: 35 g/dL (ref 30.0–36.0)
MCV: 97.1 fL (ref 78.0–100.0)
MONO ABS: 1.2 10*3/uL — AB (ref 0.1–1.0)
Monocytes Relative: 9 % (ref 3–12)
NEUTROS ABS: 11.7 10*3/uL — AB (ref 1.7–7.7)
Neutrophils Relative %: 80 % — ABNORMAL HIGH (ref 43–77)
Platelets: 259 10*3/uL (ref 150–400)
RBC: 4.15 MIL/uL — ABNORMAL LOW (ref 4.22–5.81)
RDW: 12.9 % (ref 11.5–15.5)
WBC: 14.5 10*3/uL — AB (ref 4.0–10.5)

## 2013-10-31 MED ORDER — MORPHINE SULFATE 4 MG/ML IJ SOLN: 4.0000 mg | Freq: Once | INTRAMUSCULAR | Status: DC

## 2013-10-31 MED ORDER — MORPHINE SULFATE 4 MG/ML IJ SOLN
4.0000 mg | Freq: Once | INTRAMUSCULAR | Status: AC
  Administered 2013-10-31: 4 mg via INTRAVENOUS
  Filled 2013-10-31: qty 1

## 2013-10-31 MED ORDER — HYDROMORPHONE HCL PF 1 MG/ML IJ SOLN
0.5000 mg | Freq: Once | INTRAMUSCULAR | Status: AC
Start: 2013-10-31 — End: 2013-10-31
  Administered 2013-10-31: 0.5 mg via INTRAVENOUS
  Filled 2013-10-31: qty 1

## 2013-10-31 MED ORDER — ALBUTEROL 90 MCG/ACT IN AERS: 2.0000 | INHALATION_SPRAY | RESPIRATORY_TRACT | Status: DC | PRN

## 2013-10-31 MED ORDER — HYDROMORPHONE HCL PF 1 MG/ML IJ SOLN
0.5000 mg | INTRAMUSCULAR | Status: DC | PRN
  Administered 2013-11-01 – 2013-11-04 (×11): 0.5 mg via INTRAVENOUS
  Filled 2013-10-31 (×10): qty 1

## 2013-10-31 MED ORDER — LISINOPRIL 20 MG PO TABS
20.0000 mg | ORAL_TABLET | Freq: Every day | ORAL | Status: DC
Start: 2013-11-01 — End: 2013-11-04
  Administered 2013-11-02 – 2013-11-04 (×3): 20 mg via ORAL
  Filled 2013-10-31 (×5): qty 1

## 2013-10-31 MED ORDER — ASPIRIN 325 MG PO TABS
325.0000 mg | ORAL_TABLET | Freq: Every day | ORAL | Status: DC
  Administered 2013-11-02 – 2013-11-04 (×3): 325 mg via ORAL
  Filled 2013-10-31 (×4): qty 1

## 2013-10-31 MED ORDER — PHENOBARBITAL 97.2 MG PO TABS
97.2000 mg | ORAL_TABLET | Freq: Every day | ORAL | Status: DC
  Administered 2013-11-01 – 2013-11-03 (×3): 97.2 mg via ORAL
  Filled 2013-10-31 (×3): qty 1

## 2013-10-31 MED ORDER — INSULIN ASPART 100 UNIT/ML ~~LOC~~ SOLN
0.0000 [IU] | SUBCUTANEOUS | Status: DC
  Administered 2013-11-01: 3 [IU] via SUBCUTANEOUS
  Administered 2013-11-01 (×2): 1 [IU] via SUBCUTANEOUS
  Administered 2013-11-01 – 2013-11-02 (×2): 2 [IU] via SUBCUTANEOUS
  Administered 2013-11-02: 1 [IU] via SUBCUTANEOUS
  Administered 2013-11-02: 2 [IU] via SUBCUTANEOUS

## 2013-10-31 MED ORDER — COLCHICINE 0.6 MG PO TABS: 0.6000 mg | ORAL_TABLET | Freq: Every day | ORAL | Status: DC | PRN

## 2013-10-31 MED ORDER — METHOCARBAMOL 1000 MG/10ML IJ SOLN
500.0000 mg | Freq: Four times a day (QID) | INTRAVENOUS | Status: DC | PRN
  Administered 2013-11-01 – 2013-11-03 (×3): 500 mg via INTRAVENOUS
  Filled 2013-10-31 (×4): qty 5

## 2013-10-31 MED ORDER — SIMVASTATIN 10 MG PO TABS
10.0000 mg | ORAL_TABLET | Freq: Every day | ORAL | Status: DC
  Administered 2013-11-01 – 2013-11-03 (×3): 10 mg via ORAL
  Filled 2013-10-31 (×4): qty 1

## 2013-10-31 MED ORDER — SODIUM CHLORIDE 0.9 % IV BOLUS (SEPSIS)
500.0000 mL | Freq: Once | INTRAVENOUS | Status: AC
  Administered 2013-10-31: 500 mL via INTRAVENOUS

## 2013-10-31 MED ORDER — MOMETASONE FURO-FORMOTEROL FUM 100-5 MCG/ACT IN AERO
2.0000 | INHALATION_SPRAY | Freq: Two times a day (BID) | RESPIRATORY_TRACT | Status: DC
Start: 2013-10-31 — End: 2013-11-04
  Administered 2013-11-01 – 2013-11-03 (×5): 2 via RESPIRATORY_TRACT
  Filled 2013-10-31 (×4): qty 8.8

## 2013-10-31 MED ORDER — HYDROCODONE-ACETAMINOPHEN 5-325 MG PO TABS
1.0000 | ORAL_TABLET | Freq: Four times a day (QID) | ORAL | Status: DC | PRN
  Administered 2013-11-01 – 2013-11-02 (×5): 2 via ORAL
  Filled 2013-10-31 (×5): qty 2

## 2013-10-31 NOTE — ED Notes (Signed)
Discussed need to void for urine sample 

## 2013-10-31 NOTE — ED Notes (Signed)
Dr. Garner at the bedside.  

## 2013-10-31 NOTE — ED Notes (Signed)
PT monitored by pulse ox, bp cuff, and 5-lead. 

## 2013-10-31 NOTE — ED Notes (Signed)
Attempted to call report

## 2013-10-31 NOTE — ED Provider Notes (Signed)
CSN: [id number]     Arrival date & time 10/31/13  2026 History   First MD Initiated Contact with Patient 10/31/13 2037     Chief Complaint  Patient presents with  . Hip Injury     (Consider location/radiation/quality/duration/timing/severity/associated sxs/prior Treatment) HPI Comments: Patient is a 73 year old male past history of seizure disorder, tobacco abuse, diabetes present emergency room chief complaint of left hip pain since today. The patient reports he was trying to catch a football and fell onto the street.  He also reports left elbow and hand pain.  The patient reports drinking 3 beers prior to arrival since 1730-2000. He denies use of anticoagulation. Reports chronic phenobarbital due to seizures when he was a teenager. [name],[name], MD   The history is provided by the patient. No language interpreter was used.    Past Medical History  Diagnosis Date  . Diabetes mellitus   . Hypertension   . Hypercholesteremia    Past Surgical History  Procedure Laterality Date  . Orthopedic surgery      lumbar, cervical   Family History  Problem Relation Age of Onset  . Diabetes type II     History  Substance Use Topics  . Smoking status: Current Every Day Smoker -- 2.00 packs/day for 55 years    Types: Cigarettes  . Smokeless tobacco: Not on file  . Alcohol Use: No    Review of Systems  Gastrointestinal: Negative for vomiting and abdominal pain.  Musculoskeletal: Positive for arthralgias and gait problem.  Skin: Positive for color change.  Neurological: Negative for syncope.      Allergies  Review of patient's allergies indicates no known allergies.  Home Medications   Prior to Admission medications   Medication Sig Start Date End Date Taking? Authorizing Provider  albuterol (PROVENTIL,VENTOLIN) 90 MCG/ACT inhaler Inhale 2 puffs into the lungs every 4 (four) hours as needed for wheezing. 04/19/11 04/13/12  Kela MillinAdeline C Viyuoh, MD  aspirin 325 MG EC tablet  Take 325 mg by mouth daily.    Historical Provider, MD  colchicine 0.6 MG tablet Take 0.6 mg by mouth. Takes one to two tablets as needed for gout    Historical Provider, MD  Fluticasone-Salmeterol (ADVAIR) 250-50 MCG/DOSE AEPB Inhale 1 puff into the lungs 2 (two) times daily. 04/19/11   Kela MillinAdeline C Viyuoh, MD  glimepiride (AMARYL) 2 MG tablet Take 2 mg by mouth daily before breakfast.    Historical Provider, MD  indomethacin (INDOCIN) 50 MG capsule Take 50 mg by mouth as directed.    Historical Provider, MD  lisinopril (PRINIVIL,ZESTRIL) 20 MG tablet Take 20 mg by mouth daily.    Historical Provider, MD  metFORMIN (GLUCOPHAGE) 1000 MG tablet Take 1,000 mg by mouth 2 (two) times daily with a meal.    Historical Provider, MD  PHENObarbital (LUMINAL) 100 MG tablet Take 100 mg by mouth daily.    Historical Provider, MD  pravastatin (PRAVACHOL) 40 MG tablet Take 40 mg by mouth daily.    Historical Provider, MD  predniSONE (DELTASONE) 20 MG tablet Take 2 tablets daily for 3 days, then 1 tablet daily for 3 days, then half a tablet daily for 3 days and then stop. Take With food 04/19/11   Kela MillinAdeline C Viyuoh, MD  sildenafil (VIAGRA) 100 MG tablet Take 100 mg by mouth daily as needed.    Historical Provider, MD  tiotropium (SPIRIVA) 18 MCG inhalation capsule Place 1 capsule (18 mcg total) into inhaler and inhale daily. 04/19/11 04/18/12  Adeline  Joselyn Glassman, MD  Vitamin D, Ergocalciferol, (DRISDOL) 50000 UNITS CAPS Take 50,000 Units by mouth every 7 (seven) days.    Historical Provider, MD   BP 118/61  Pulse 73  Resp 19  Ht 5\' 9"  (1.753 m)  Wt 218 lb (98.884 kg)  BMI 32.18 kg/m2  SpO2 91% Physical Exam  Vitals reviewed. Constitutional: He appears well-developed and well-nourished.  HENT:  Head: Normocephalic and atraumatic.  Mouth/Throat: Mucous membranes are dry.  Neck: Neck supple.  Cardiovascular: Normal rate and regular rhythm.   Pulses:      Dorsalis pedis pulses are 2+ on the right side, and 2+ on  the left side.  Pulmonary/Chest: No respiratory distress. He has no decreased breath sounds. He has wheezes.  Mild expiratory wheezing bilaterally.  Abdominal: Soft. There is no tenderness.  Musculoskeletal:       Left elbow: He exhibits swelling.       Left hip: He exhibits deformity.       Left hand: He exhibits tenderness. He exhibits no deformity and no laceration.  Left upper extremity: Left elbow with ecchymosis. Left leg shortened, externally rotated.  Skin: Skin is warm and dry. He is not diaphoretic.    ED Course  Procedures (including critical care time) Labs Review Results for orders placed during the hospital encounter of 10/31/13  CBC WITH DIFFERENTIAL      Result Value Ref Range   WBC 14.5 (*) 4.0 - 10.5 K/uL   RBC 4.15 (*) 4.22 - 5.81 MIL/uL   Hemoglobin 14.1  13.0 - 17.0 g/dL   HCT 16.1  09.6 - 04.5 %   MCV 97.1  78.0 - 100.0 fL   MCH 34.0  26.0 - 34.0 pg   MCHC 35.0  30.0 - 36.0 g/dL   RDW 40.9  81.1 - 91.4 %   Platelets 259  150 - 400 K/uL   Neutrophils Relative % 80 (*) 43 - 77 %   Neutro Abs 11.7 (*) 1.7 - 7.7 K/uL   Lymphocytes Relative 10 (*) 12 - 46 %   Lymphs Abs 1.5  0.7 - 4.0 K/uL   Monocytes Relative 9  3 - 12 %   Monocytes Absolute 1.2 (*) 0.1 - 1.0 K/uL   Eosinophils Relative 1  0 - 5 %   Eosinophils Absolute 0.1  0.0 - 0.7 K/uL   Basophils Relative 0  0 - 1 %   Basophils Absolute 0.0  0.0 - 0.1 K/uL  BASIC METABOLIC PANEL      Result Value Ref Range   Sodium 136 (*) 137 - 147 mEq/L   Potassium 4.6  3.7 - 5.3 mEq/L   Chloride 100  96 - 112 mEq/L   CO2 21  19 - 32 mEq/L   Glucose, Bld 118 (*) 70 - 99 mg/dL   BUN 8  6 - 23 mg/dL   Creatinine, Ser 7.82  0.50 - 1.35 mg/dL   Calcium 9.0  8.4 - 95.6 mg/dL   GFR calc non Af Amer >90  >90 mL/min   GFR calc Af Amer >90  >90 mL/min   Anion gap 15  5 - 15  URINALYSIS, ROUTINE W REFLEX MICROSCOPIC      Result Value Ref Range   Color, Urine YELLOW  YELLOW   APPearance CLEAR  CLEAR   Specific  Gravity, Urine 1.010  1.005 - 1.030   pH 5.5  5.0 - 8.0   Glucose, UA NEGATIVE  NEGATIVE mg/dL   Hgb urine  dipstick NEGATIVE  NEGATIVE   Bilirubin Urine NEGATIVE  NEGATIVE   Ketones, ur NEGATIVE  NEGATIVE mg/dL   Protein, ur NEGATIVE  NEGATIVE mg/dL   Urobilinogen, UA 1.0  0.0 - 1.0 mg/dL   Nitrite NEGATIVE  NEGATIVE   Leukocytes, UA NEGATIVE  NEGATIVE   Dg Chest 1 View  10/31/2013   CLINICAL DATA:  Status post fall.  Concern for chest injury.  EXAM: CHEST - 1 VIEW  COMPARISON:  Chest radiograph performed 04/15/2011  FINDINGS: The lungs are well-aerated. Pulmonary vascularity is at the upper limits of normal. There is no evidence of focal opacification, pleural effusion or pneumothorax. Minimal left-sided peripheral density is thought to reflect overlying soft tissues.  The cardiomediastinal silhouette is within normal limits. No acute osseous abnormalities are seen. Cervical spinal fusion hardware is noted. There is slight chronic irregularity of the distal left clavicle.  IMPRESSION: No acute cardiopulmonary process seen. No displaced rib fractures identified.   Electronically Signed   By: Roanna Raider M.D.   On: 10/31/2013 22:04   Dg Elbow Complete Left  11/01/2013   CLINICAL DATA:  Larey Seat, left elbow pain  EXAM: LEFT ELBOW - COMPLETE 3+ VIEW  COMPARISON:  None.  FINDINGS: There is no evidence of fracture, dislocation, or joint effusion. There is no evidence of arthropathy or other focal bone abnormality. Soft tissues are unremarkable.  IMPRESSION: Negative.   Electronically Signed   By: Esperanza Heir M.D.   On: 11/01/2013 00:27   Dg Hip Complete Left  10/31/2013   CLINICAL DATA:  Status post fall.  Left hip pain.  EXAM: LEFT HIP - COMPLETE 2+ VIEW  COMPARISON:  None.  FINDINGS: There is a transcervical fracture through the left femoral neck, with mild superior displacement of the distal femur, and mild rotation of the femoral head. The left femoral head remains seated at the acetabulum.  The  right hip joint is grossly unremarkable in appearance. No significant degenerative change is appreciated. The sacroiliac joints are unremarkable in appearance.  The visualized bowel gas pattern is grossly unremarkable in appearance.  IMPRESSION: Transcervical fracture through the left femoral neck, with mild superior displacement of the distal femur, and mild rotation of the femoral head.   Electronically Signed   By: Roanna Raider M.D.   On: 10/31/2013 21:59   Dg Hand Complete Left  11/01/2013   CLINICAL DATA:  Fall, left hand pain  EXAM: LEFT HAND - COMPLETE 3+ VIEW  COMPARISON:  None.  FINDINGS: Diffuse osteopenia.  No fracture or dislocation.  IMPRESSION: No acute findings   Electronically Signed   By: Esperanza Heir M.D.   On: 11/01/2013 00:28     EKG Interpretation   Date/Time:  Saturday October 31 2013 22:05:26 EDT Ventricular Rate:  66 PR Interval:  186 QRS Duration: 101 QT Interval:  412 QTC Calculation: 432 R Axis:   -52 Text Interpretation:  Sinus rhythm Atrial premature complexes LAD,  consider left anterior fascicular block No significant change since last  tracing Confirmed by HARRISON  MD, FORREST ([id number]) on 10/31/2013 10:38:36 PM      MDM   Final diagnoses:  Hip fracture, left, closed, initial encounter   Patient presents after mechanical fall, obvious deformity to the left lower [place] a concerning for fracture. Patient declines pain medication at this time. Labs x-rays ordered. Dr. Romeo Apple also evaluated the patient during this encounter.  Discussed patient history, x-ray results with Dr. Eulah Pont who agrees to consult on patient and plan for  surgery tomorrow morning. Reevaluation patient requesting pain medication this time. Discussed x-ray and need for surgery with the patient and patient's wife. Agree to admission.  Dr. Julian Reil to admit the patient to medicine.    Mellody Drown, PA-C 11/01/13 [phone]

## 2013-10-31 NOTE — ED Notes (Signed)
Per EMS, the patient was trying to catch a ball and fell.  Pain 9/10 in left hip and he is unable to bear weight on it.  18g present on arrival, given 250mcg of fentanyl with minimal relief.  Left leg is outwardly facing, and shorter in comparison to right leg.  Patient had 3 beers tonight. BP en route 158/90, P 82, normal sinus on the monitor.  Patient has history of multiple surgeries, including L5 ruptured disc, and neck surgeries.

## 2013-10-31 NOTE — Consult Note (Signed)
ORTHOPAEDIC CONSULTATION  REQUESTING PHYSICIAN: Pamella Pert, MD  Chief Complaint: Left femoral neck fracture  HPI: Keith Wise is a 73 y.o. male who complains of  Left hip pain after a mechanical fall  Past Medical History  Diagnosis Date  . Diabetes mellitus   . Hypertension   . Hypercholesteremia    Past Surgical History  Procedure Laterality Date  . Orthopedic surgery      lumbar, cervical   History   Social History  . Marital Status: Married    Spouse Name: N/A    Number of Children: N/A  . Years of Education: N/A   Social History Main Topics  . Smoking status: Current Every Day Smoker -- 2.00 packs/day for 55 years    Types: Cigarettes  . Smokeless tobacco: Not on file  . Alcohol Use: No  . Drug Use: No  . Sexual Activity: Not on file   Other Topics Concern  . Not on file   Social History Narrative  . No narrative on file   Family History  Problem Relation Age of Onset  . Diabetes type II     No Known Allergies Prior to Admission medications   Medication Sig Start Date End Date Taking? Authorizing Provider  albuterol (PROVENTIL,VENTOLIN) 90 MCG/ACT inhaler Inhale 2 puffs into the lungs every 4 (four) hours as needed for wheezing. 04/19/11 10/31/13 Yes Adeline C Viyuoh, MD  colchicine 0.6 MG tablet Take 0.6 mg by mouth daily as needed (for gout).    Yes Historical Provider, MD  ibuprofen (ADVIL,MOTRIN) 200 MG tablet Take 400 mg by mouth every morning.   Yes Historical Provider, MD  aspirin 325 MG tablet Take 325 mg by mouth daily.    Historical Provider, MD  Fluticasone-Salmeterol (ADVAIR) 250-50 MCG/DOSE AEPB Inhale 1 puff into the lungs 2 (two) times daily.    Historical Provider, MD  glimepiride (AMARYL) 2 MG tablet Take 2 mg by mouth daily with breakfast.    Historical Provider, MD  indomethacin (INDOCIN) 50 MG capsule Take 50 mg by mouth daily.     Historical Provider, MD  lisinopril (PRINIVIL,ZESTRIL) 20 MG tablet Take 20 mg by mouth  daily.    Historical Provider, MD  metFORMIN (GLUCOPHAGE) 1000 MG tablet Take 1,000 mg by mouth 2 (two) times daily with a meal.    Historical Provider, MD  PHENObarbital (LUMINAL) 100 MG tablet Take 100 mg by mouth daily.    Historical Provider, MD  pravastatin (PRAVACHOL) 40 MG tablet Take 40 mg by mouth daily.    Historical Provider, MD  predniSONE (DELTASONE) 20 MG tablet Take 2 tablets daily for 3 days, then 1 tablet daily for 3 days, then half a tablet daily for 3 days and then stop. Take With food 04/19/11   Sheila Oats, MD  sildenafil (VIAGRA) 100 MG tablet Take 100 mg by mouth daily as needed.    Historical Provider, MD  tiotropium (SPIRIVA) 18 MCG inhalation capsule Place 1 capsule (18 mcg total) into inhaler and inhale daily. 04/19/11 04/18/12  Sheila Oats, MD  Vitamin D, Ergocalciferol, (DRISDOL) 50000 UNITS CAPS Take 50,000 Units by mouth every 7 (seven) days.    Historical Provider, MD   Dg Chest 1 View  10/31/2013   CLINICAL DATA:  Status post fall.  Concern for chest injury.  EXAM: CHEST - 1 VIEW  COMPARISON:  Chest radiograph performed 04/15/2011  FINDINGS: The lungs are well-aerated. Pulmonary vascularity is at the upper limits of normal.  There is no evidence of focal opacification, pleural effusion or pneumothorax. Minimal left-sided peripheral density is thought to reflect overlying soft tissues.  The cardiomediastinal silhouette is within normal limits. No acute osseous abnormalities are seen. Cervical spinal fusion hardware is noted. There is slight chronic irregularity of the distal left clavicle.  IMPRESSION: No acute cardiopulmonary process seen. No displaced rib fractures identified.   Electronically Signed   By: Garald Balding M.D.   On: 10/31/2013 22:04   Dg Hip Complete Left  10/31/2013   CLINICAL DATA:  Status post fall.  Left hip pain.  EXAM: LEFT HIP - COMPLETE 2+ VIEW  COMPARISON:  None.  FINDINGS: There is a transcervical fracture through the left femoral neck,  with mild superior displacement of the distal femur, and mild rotation of the femoral head. The left femoral head remains seated at the acetabulum.  The right hip joint is grossly unremarkable in appearance. No significant degenerative change is appreciated. The sacroiliac joints are unremarkable in appearance.  The visualized bowel gas pattern is grossly unremarkable in appearance.  IMPRESSION: Transcervical fracture through the left femoral neck, with mild superior displacement of the distal femur, and mild rotation of the femoral head.   Electronically Signed   By: Garald Balding M.D.   On: 10/31/2013 21:59    Positive ROS: All other systems have been reviewed and were otherwise negative with the exception of those mentioned in the HPI and as above.  Labs cbc  Recent Labs  10/31/13 2156  WBC 14.5*  HGB 14.1  HCT 40.3  PLT 259    Labs inflam No results found for this basename: ESR, CRP,  in the last 72 hours  Labs coag No results found for this basename: INR, PT, PTT,  in the last 72 hours   Recent Labs  10/31/13 2156  NA 136*  K 4.6  CL 100  CO2 21  GLUCOSE 118*  BUN 8  CREATININE 0.71  CALCIUM 9.0    Physical Exam: Filed Vitals:   10/31/13 2245  BP: 144/75  Pulse: 86  Resp: 18   General: Alert, no acute distress Cardiovascular: No pedal edema Respiratory: No cyanosis, no use of accessory musculature GI: No organomegaly, abdomen is soft and non-tender Skin: No lesions in the area of chief complaint other than those listed below in MSK exam.  Neurologic: Sensation intact distally Psychiatric: Patient is competent for consent with normal mood and affect Lymphatic: No axillary or cervical lymphadenopathy  MUSCULOSKELETAL:  SILT DP/SP/S/S/T nerve, 2+ DP, +TA/GS/EHL Compartments soft  Other extremities are atraumatic with painless ROM and NVI.  Assessment: Left femoral neck fracture displaced  Plan: Hemiarthroplasty  Weight Bearing Status: bedrest then  WBAT post op PT VTE px: SCD's and ASA post op likely   Edmonia Lynch, D, MD Cell [phone]   10/31/2013 10:59 PM

## 2013-10-31 NOTE — H&P (Signed)
[facility] History and Physical  KELDAN EPLIN OZH:[id number] DOB: June 11, 1940 DOA: 10/31/2013  Referring physician: EDP PCP: Kaleen Mask, MD   Chief Complaint: Fall, hip pain   HPI: Keith Wise is a 73 y.o. male who was playing football earlier this afternoon when he fell and landed on concrete.  He fell on his Left side and had immediate left hip pain, severe, 10/10, worse with movement, better with rest, associated inability to ambulate.  Left hip fracture found on ED work up.  Review of Systems: patient reports no chest pain, cardiac history, does have a history of COPD but apparently this isnt severe enough to prevent him from playing football.  Does have chronic lower extremity pain and suspicion of PAD.  Systems reviewed.  As above, otherwise negative  Past Medical History  Diagnosis Date  . Diabetes mellitus   . Hypertension   . Hypercholesteremia    Past Surgical History  Procedure Laterality Date  . Orthopedic surgery      lumbar, cervical   Social History:  reports that he has been smoking Cigarettes.  He has a 110 pack-year smoking history. He does not have any smokeless tobacco history on file. He reports that he does not drink alcohol or use illicit drugs.  No Known Allergies  Family History  Problem Relation Age of Onset  . Diabetes type II       Prior to Admission medications   Medication Sig Start Date End Date Taking? Authorizing Provider  albuterol (PROVENTIL,VENTOLIN) 90 MCG/ACT inhaler Inhale 2 puffs into the lungs every 4 (four) hours as needed for wheezing. 04/19/11 10/31/13 Yes Adeline C Viyuoh, MD  colchicine 0.6 MG tablet Take 0.6 mg by mouth daily as needed (for gout).    Yes Historical Provider, MD  ibuprofen (ADVIL,MOTRIN) 200 MG tablet Take 400 mg by mouth every morning.   Yes Historical Provider, MD  aspirin 325 MG tablet Take 325 mg by mouth daily.    Historical Provider, MD  Fluticasone-Salmeterol (ADVAIR) 250-50 MCG/DOSE AEPB  Inhale 1 puff into the lungs 2 (two) times daily.    Historical Provider, MD  glimepiride (AMARYL) 2 MG tablet Take 2 mg by mouth daily with breakfast.    Historical Provider, MD  indomethacin (INDOCIN) 50 MG capsule Take 50 mg by mouth daily.     Historical Provider, MD  lisinopril (PRINIVIL,ZESTRIL) 20 MG tablet Take 20 mg by mouth daily.    Historical Provider, MD  metFORMIN (GLUCOPHAGE) 1000 MG tablet Take 1,000 mg by mouth 2 (two) times daily with a meal.    Historical Provider, MD  PHENObarbital (LUMINAL) 100 MG tablet Take 100 mg by mouth daily.    Historical Provider, MD  pravastatin (PRAVACHOL) 40 MG tablet Take 40 mg by mouth daily.    Historical Provider, MD  predniSONE (DELTASONE) 20 MG tablet Take 2 tablets daily for 3 days, then 1 tablet daily for 3 days, then half a tablet daily for 3 days and then stop. Take With food 04/19/11   Kela Millin, MD  sildenafil (VIAGRA) 100 MG tablet Take 100 mg by mouth daily as needed.    Historical Provider, MD  tiotropium (SPIRIVA) 18 MCG inhalation capsule Place 1 capsule (18 mcg total) into inhaler and inhale daily. 04/19/11 04/18/12  Kela Millin, MD  Vitamin D, Ergocalciferol, (DRISDOL) 50000 UNITS CAPS Take 50,000 Units by mouth every 7 (seven) days.    Historical Provider, MD   Physical Exam: Filed Vitals:   10/31/13  2300  BP: 133/71  Pulse: 69  Resp: 16    BP 133/71  Pulse 69  Resp 16  Ht 5\' 9"  (1.753 m)  Wt 98.884 kg (218 lb)  BMI 32.18 kg/m2  SpO2 96%  General Appearance:    Alert, oriented, intermittent distress due to severe hip pain, appears stated age  Head:    Normocephalic, atraumatic  Eyes:    PERRL, EOMI, sclera non-icteric        Nose:   Nares without drainage or epistaxis. Mucosa, turbinates normal  Throat:   Moist mucous membranes. Oropharynx without erythema or exudate.  Neck:   Supple. No carotid bruits.  No thyromegaly.  No lymphadenopathy.   Back:     No CVA tenderness, no spinal tenderness  Lungs:      Clear to auscultation bilaterally, without wheezes, rhonchi or rales  Chest wall:    No tenderness to palpitation  Heart:    Regular rate and rhythm without murmurs, gallops, rubs  Abdomen:     Soft, non-tender, nondistended, normal bowel sounds, no organomegaly  Genitalia:    deferred  Rectal:    deferred  Extremities:   No clubbing, cyanosis or edema.  Pulses:   2+ and symmetric all extremities  Skin:   Skin color, texture, turgor normal, no rashes or lesions  Lymph nodes:   Cervical, supraclavicular, and axillary nodes normal  Neurologic:   CNII-XII intact. Normal strength, sensation and reflexes      throughout    Labs on Admission:  Basic Metabolic Panel:  Recent Labs Lab 10/31/13 2156  NA 136*  K 4.6  CL 100  CO2 21  GLUCOSE 118*  BUN 8  CREATININE 0.71  CALCIUM 9.0   Liver Function Tests: No results found for this basename: AST, ALT, ALKPHOS, BILITOT, PROT, ALBUMIN,  in the last 168 hours No results found for this basename: LIPASE, AMYLASE,  in the last 168 hours No results found for this basename: AMMONIA,  in the last 168 hours CBC:  Recent Labs Lab 10/31/13 2156  WBC 14.5*  NEUTROABS 11.7*  HGB 14.1  HCT 40.3  MCV 97.1  PLT 259   Cardiac Enzymes: No results found for this basename: CKTOTAL, CKMB, CKMBINDEX, TROPONINI,  in the last 168 hours  BNP (last 3 results) No results found for this basename: PROBNP,  in the last 8760 hours CBG: No results found for this basename: GLUCAP,  in the last 168 hours  Radiological Exams on Admission: Dg Chest 1 View  10/31/2013   CLINICAL DATA:  Status post fall.  Concern for chest injury.  EXAM: CHEST - 1 VIEW  COMPARISON:  Chest radiograph performed 04/15/2011  FINDINGS: The lungs are well-aerated. Pulmonary vascularity is at the upper limits of normal. There is no evidence of focal opacification, pleural effusion or pneumothorax. Minimal left-sided peripheral density is thought to reflect overlying soft tissues.  The  cardiomediastinal silhouette is within normal limits. No acute osseous abnormalities are seen. Cervical spinal fusion hardware is noted. There is slight chronic irregularity of the distal left clavicle.  IMPRESSION: No acute cardiopulmonary process seen. No displaced rib fractures identified.   Electronically Signed   By: Roanna Raider M.D.   On: 10/31/2013 22:04   Dg Hip Complete Left  10/31/2013   CLINICAL DATA:  Status post fall.  Left hip pain.  EXAM: LEFT HIP - COMPLETE 2+ VIEW  COMPARISON:  None.  FINDINGS: There is a transcervical fracture through the left femoral neck, with  mild superior displacement of the distal femur, and mild rotation of the femoral head. The left femoral head remains seated at the acetabulum.  The right hip joint is grossly unremarkable in appearance. No significant degenerative change is appreciated. The sacroiliac joints are unremarkable in appearance.  The visualized bowel gas pattern is grossly unremarkable in appearance.  IMPRESSION: Transcervical fracture through the left femoral neck, with mild superior displacement of the distal femur, and mild rotation of the femoral head.   Electronically Signed   By: Roanna RaiderJeffery  Chang M.D.   On: 10/31/2013 21:59    EKG: Independently reviewed.  Assessment/Plan Principal Problem:   Left displaced femoral neck fracture Active Problems:   Diabetes mellitus   1. Left hip fracture - Dr. Eulah PontMurphy planning OR tomorrow AM, patient NPO, on ASA + SCDs only for DVT ppx.  Hip fracture pathway, will try robaxin for spasms if needed. 2. DM - holding home meds and putting on low dose SSI q4h while he is NPO for surgery. 3. Seizure disorder - has not had a seizure "in a long time" per his wife, will continue phenobarb that he takes chronically at home.    Code Status: Full Code  Family Communication: Wife at bedside Disposition Plan: Admit to inpatient   Time spent: 70 min  GARDNER, JARED M. [facility] Pager [phone]  If  7AM-7PM, please contact the day team taking care of the patient [email or web address] Password [email or web address] 10/31/2013, 11:10 PM

## 2013-11-01 ENCOUNTER — Inpatient Hospital Stay (HOSPITAL_COMMUNITY): Payer: 59 | Admitting: Anesthesiology

## 2013-11-01 ENCOUNTER — Encounter (HOSPITAL_COMMUNITY): Payer: 59 | Admitting: Anesthesiology

## 2013-11-01 ENCOUNTER — Inpatient Hospital Stay: Admit: 2013-11-01 | Payer: Self-pay | Admitting: Orthopedic Surgery

## 2013-11-01 ENCOUNTER — Inpatient Hospital Stay (HOSPITAL_COMMUNITY): Payer: 59

## 2013-11-01 ENCOUNTER — Encounter (HOSPITAL_COMMUNITY)
Admission: EM | Disposition: A | Discharge status: Skilled Nursing Facility | Payer: Self-pay | Source: Home / Self Care | Attending: Internal Medicine

## 2013-11-01 DIAGNOSIS — F17200 Nicotine dependence, unspecified, uncomplicated: Secondary | ICD-10-CM

## 2013-11-01 HISTORY — PX: HIP ARTHROPLASTY: SHX981

## 2013-11-01 HISTORY — PX: HEMIARTHROPLASTY HIP: SUR652

## 2013-11-01 LAB — URINALYSIS, ROUTINE W REFLEX MICROSCOPIC
Bilirubin Urine: NEGATIVE
GLUCOSE, UA: NEGATIVE mg/dL
Hgb urine dipstick: NEGATIVE
KETONES UR: NEGATIVE mg/dL
LEUKOCYTES UA: NEGATIVE
Nitrite: NEGATIVE
PH: 5.5 (ref 5.0–8.0)
Protein, ur: NEGATIVE mg/dL
SPECIFIC GRAVITY, URINE: 1.01 (ref 1.005–1.030)
Urobilinogen, UA: 1 mg/dL (ref 0.0–1.0)

## 2013-11-01 LAB — GLUCOSE, CAPILLARY
Glucose-Capillary: 115 mg/dL — ABNORMAL HIGH (ref 70–99)
Glucose-Capillary: 129 mg/dL — ABNORMAL HIGH (ref 70–99)
Glucose-Capillary: 138 mg/dL — ABNORMAL HIGH (ref 70–99)
Glucose-Capillary: 161 mg/dL — ABNORMAL HIGH (ref 70–99)
Glucose-Capillary: 187 mg/dL — ABNORMAL HIGH (ref 70–99)
Glucose-Capillary: 222 mg/dL — ABNORMAL HIGH (ref 70–99)

## 2013-11-01 SURGERY — HEMIARTHROPLASTY, HIP, DIRECT ANTERIOR APPROACH, FOR FRACTURE
Anesthesia: General | Site: Hip | Laterality: Left

## 2013-11-01 MED ORDER — NICOTINE 21 MG/24HR TD PT24
21.0000 mg | MEDICATED_PATCH | Freq: Every day | TRANSDERMAL | Status: DC
  Administered 2013-11-03 – 2013-11-04 (×2): 21 mg via TRANSDERMAL
  Filled 2013-11-01 (×4): qty 1

## 2013-11-01 MED ORDER — OXYCODONE HCL 5 MG PO TABS
5.0000 mg | ORAL_TABLET | Freq: Once | ORAL | Status: AC | PRN
  Administered 2013-11-01: 5 mg via ORAL

## 2013-11-01 MED ORDER — DOCUSATE SODIUM 100 MG PO CAPS: 100.0000 mg | ORAL_CAPSULE | Freq: Two times a day (BID) | ORAL | Status: AC

## 2013-11-01 MED ORDER — ROCURONIUM BROMIDE 50 MG/5ML IV SOLN
INTRAVENOUS | Status: AC
  Filled 2013-11-01: qty 1

## 2013-11-01 MED ORDER — FENTANYL CITRATE 0.05 MG/ML IJ SOLN
INTRAMUSCULAR | Status: AC
  Filled 2013-11-01: qty 5

## 2013-11-01 MED ORDER — CEFAZOLIN SODIUM-DEXTROSE 2-3 GM-% IV SOLR
2.0000 g | Freq: Four times a day (QID) | INTRAVENOUS | Status: AC
  Administered 2013-11-01 (×2): 2 g via INTRAVENOUS
  Filled 2013-11-01 (×3): qty 50

## 2013-11-01 MED ORDER — ASPIRIN EC 325 MG PO TBEC: 325.0000 mg | DELAYED_RELEASE_TABLET | Freq: Every day | ORAL | Status: AC

## 2013-11-01 MED ORDER — HYDROMORPHONE HCL PF 1 MG/ML IJ SOLN: 1.0000 mg | INTRAMUSCULAR | Status: DC | PRN

## 2013-11-01 MED ORDER — HYDROMORPHONE HCL PF 1 MG/ML IJ SOLN
0.2500 mg | INTRAMUSCULAR | Status: DC | PRN
Start: 2013-11-01 — End: 2013-11-01
  Administered 2013-11-01 (×7): 0.5 mg via INTRAVENOUS

## 2013-11-01 MED ORDER — ACETAMINOPHEN 650 MG RE SUPP: 650.0000 mg | Freq: Four times a day (QID) | RECTAL | Status: DC | PRN

## 2013-11-01 MED ORDER — PROPOFOL 10 MG/ML IV BOLUS
INTRAVENOUS | Status: AC
  Filled 2013-11-01: qty 20

## 2013-11-01 MED ORDER — CEFAZOLIN SODIUM-DEXTROSE 2-3 GM-% IV SOLR
INTRAVENOUS | Status: AC
  Filled 2013-11-01: qty 50

## 2013-11-01 MED ORDER — LIDOCAINE HCL (CARDIAC) 20 MG/ML IV SOLN
INTRAVENOUS | Status: AC
  Filled 2013-11-01: qty 5

## 2013-11-01 MED ORDER — CEFAZOLIN SODIUM-DEXTROSE 2-3 GM-% IV SOLR
INTRAVENOUS | Status: DC | PRN
  Administered 2013-11-01: 2 g via INTRAVENOUS

## 2013-11-01 MED ORDER — FENTANYL CITRATE 0.05 MG/ML IJ SOLN
INTRAMUSCULAR | Status: DC | PRN
  Administered 2013-11-01: 100 ug via INTRAVENOUS
  Administered 2013-11-01 (×2): 50 ug via INTRAVENOUS
  Administered 2013-11-01 (×3): 100 ug via INTRAVENOUS

## 2013-11-01 MED ORDER — METHOCARBAMOL 500 MG PO TABS: 500.0000 mg | ORAL_TABLET | Freq: Four times a day (QID) | ORAL | Status: DC | PRN

## 2013-11-01 MED ORDER — ONDANSETRON HCL 4 MG PO TABS: 4.0000 mg | ORAL_TABLET | Freq: Three times a day (TID) | ORAL | Status: DC | PRN

## 2013-11-01 MED ORDER — ALBUTEROL SULFATE (2.5 MG/3ML) 0.083% IN NEBU: 2.5000 mg | INHALATION_SOLUTION | RESPIRATORY_TRACT | Status: DC | PRN

## 2013-11-01 MED ORDER — OXYCODONE HCL 5 MG/5ML PO SOLN: 5.0000 mg | Freq: Once | ORAL | Status: AC | PRN

## 2013-11-01 MED ORDER — LABETALOL HCL 5 MG/ML IV SOLN
INTRAVENOUS | Status: AC
  Filled 2013-11-01: qty 4

## 2013-11-01 MED ORDER — LIDOCAINE HCL (CARDIAC) 20 MG/ML IV SOLN
INTRAVENOUS | Status: DC | PRN
  Administered 2013-11-01: 80 mg via INTRAVENOUS

## 2013-11-01 MED ORDER — ACETAMINOPHEN 160 MG/5ML PO SOLN
325.0000 mg | ORAL | Status: DC | PRN
  Filled 2013-11-01: qty 20.3

## 2013-11-01 MED ORDER — ROCURONIUM BROMIDE 100 MG/10ML IV SOLN
INTRAVENOUS | Status: DC | PRN
  Administered 2013-11-01: 50 mg via INTRAVENOUS

## 2013-11-01 MED ORDER — MORPHINE SULFATE 2 MG/ML IJ SOLN: 0.5000 mg | INTRAMUSCULAR | Status: DC | PRN

## 2013-11-01 MED ORDER — MENTHOL 3 MG MT LOZG: 1.0000 | LOZENGE | OROMUCOSAL | Status: DC | PRN

## 2013-11-01 MED ORDER — SODIUM CHLORIDE 0.9 % IV SOLN
INTRAVENOUS | Status: DC | PRN
  Administered 2013-11-01 (×3): via INTRAVENOUS

## 2013-11-01 MED ORDER — ACETAMINOPHEN 325 MG PO TABS
650.0000 mg | ORAL_TABLET | Freq: Four times a day (QID) | ORAL | Status: DC | PRN
  Administered 2013-11-04: 650 mg via ORAL
  Filled 2013-11-01: qty 2

## 2013-11-01 MED ORDER — ASPIRIN EC 325 MG PO TBEC: 325.0000 mg | DELAYED_RELEASE_TABLET | Freq: Every day | ORAL | Status: DC

## 2013-11-01 MED ORDER — EPHEDRINE SULFATE 50 MG/ML IJ SOLN
INTRAMUSCULAR | Status: AC
  Filled 2013-11-01: qty 1

## 2013-11-01 MED ORDER — HYDROCODONE-ACETAMINOPHEN 5-325 MG PO TABS: 1.0000 | ORAL_TABLET | ORAL | Status: DC | PRN

## 2013-11-01 MED ORDER — HYDROMORPHONE HCL PF 1 MG/ML IJ SOLN
INTRAMUSCULAR | Status: AC
  Administered 2013-11-01: 0.5 mg via INTRAVENOUS
  Filled 2013-11-01: qty 2

## 2013-11-01 MED ORDER — EPHEDRINE SULFATE 50 MG/ML IJ SOLN
INTRAMUSCULAR | Status: DC | PRN
  Administered 2013-11-01 (×2): 10 mg via INTRAVENOUS

## 2013-11-01 MED ORDER — ACETAMINOPHEN 325 MG PO TABS: 325.0000 mg | ORAL_TABLET | ORAL | Status: DC | PRN

## 2013-11-01 MED ORDER — ONDANSETRON HCL 4 MG/2ML IJ SOLN: 4.0000 mg | Freq: Three times a day (TID) | INTRAMUSCULAR | Status: DC | PRN

## 2013-11-01 MED ORDER — PHENOL 1.4 % MT LIQD: 1.0000 | OROMUCOSAL | Status: DC | PRN

## 2013-11-01 MED ORDER — PROPOFOL 10 MG/ML IV BOLUS
INTRAVENOUS | Status: DC | PRN
  Administered 2013-11-01: 20 mg via INTRAVENOUS
  Administered 2013-11-01: 160 mg via INTRAVENOUS
  Administered 2013-11-01: 20 mg via INTRAVENOUS

## 2013-11-01 MED ORDER — OXYCODONE HCL 5 MG PO TABS
ORAL_TABLET | ORAL | Status: AC
  Filled 2013-11-01: qty 1

## 2013-11-01 MED ORDER — LABETALOL HCL 5 MG/ML IV SOLN
INTRAVENOUS | Status: DC | PRN
  Administered 2013-11-01: 5 mg via INTRAVENOUS

## 2013-11-01 SURGICAL SUPPLY — 43 items
BLADE SAGITTAL 25.0X1.27X90 (BLADE) ×2 IMPLANT
BLADE SAGITTAL 25.0X1.27X90MM (BLADE) ×1
COVER BACK TABLE 24X17X13 BIG (DRAPES) IMPLANT
DRAPE ORTHO SPLIT 77X108 STRL (DRAPES) ×6
DRAPE SURG ORHT 6 SPLT 77X108 (DRAPES) ×2 IMPLANT
DRAPE U-SHAPE 47X51 STRL (DRAPES) ×3 IMPLANT
DRILL BIT 5/64 (BIT) ×3 IMPLANT
DRSG MEPILEX BORDER 4X8 (GAUZE/BANDAGES/DRESSINGS) ×3 IMPLANT
DURAPREP 26ML APPLICATOR (WOUND CARE) ×3 IMPLANT
ELECT CAUTERY BLADE 6.4 (BLADE) ×3 IMPLANT
ELECT REM PT RETURN 9FT ADLT (ELECTROSURGICAL) ×3
ELECTRODE REM PT RTRN 9FT ADLT (ELECTROSURGICAL) ×1 IMPLANT
FACESHIELD WRAPAROUND (MASK) ×3 IMPLANT
FACESHIELD WRAPAROUND OR TEAM (MASK) ×1 IMPLANT
GLOVE BIO SURGEON STRL SZ7.5 (GLOVE) IMPLANT
GLOVE BIOGEL PI IND STRL 8 (GLOVE) ×1 IMPLANT
GLOVE BIOGEL PI INDICATOR 8 (GLOVE) ×2
GOWN STRL REUS W/ TWL LRG LVL3 (GOWN DISPOSABLE) ×1 IMPLANT
GOWN STRL REUS W/ TWL XL LVL3 (GOWN DISPOSABLE) ×1 IMPLANT
GOWN STRL REUS W/TWL LRG LVL3 (GOWN DISPOSABLE) ×3
GOWN STRL REUS W/TWL XL LVL3 (GOWN DISPOSABLE) ×3
HANDPIECE INTERPULSE COAX TIP (DISPOSABLE)
HIP HEMIARTHROPLASTY LEV 1A ×2 IMPLANT
KIT BASIN OR (CUSTOM PROCEDURE TRAY) ×3 IMPLANT
KIT ROOM TURNOVER OR (KITS) ×3 IMPLANT
MANIFOLD NEPTUNE II (INSTRUMENTS) ×3 IMPLANT
NS IRRIG 1000ML POUR BTL (IV SOLUTION) ×3 IMPLANT
PACK TOTAL JOINT (CUSTOM PROCEDURE TRAY) ×3 IMPLANT
PAD ARMBOARD 7.5X6 YLW CONV (MISCELLANEOUS) ×6 IMPLANT
PILLOW ABDUCTION HIP (SOFTGOODS) ×3 IMPLANT
RETRIEVER SUT HEWSON (MISCELLANEOUS) ×3 IMPLANT
SET HNDPC FAN SPRY TIP SCT (DISPOSABLE) ×1 IMPLANT
SUT FIBERWIRE #2 38 REV NDL BL (SUTURE) ×6
SUT MNCRL AB 4-0 PS2 18 (SUTURE) ×3 IMPLANT
SUT MON AB 2-0 CT1 36 (SUTURE) ×3 IMPLANT
SUT VIC AB 0 CT1 27 (SUTURE) ×6
SUT VIC AB 0 CT1 27XBRD ANBCTR (SUTURE) ×1 IMPLANT
SUTURE FIBERWR#2 38 REV NDL BL (SUTURE) ×2 IMPLANT
TOWEL OR 17X24 6PK STRL BLUE (TOWEL DISPOSABLE) ×3 IMPLANT
TOWEL OR 17X26 10 PK STRL BLUE (TOWEL DISPOSABLE) ×3 IMPLANT
TOWEL OR NON WOVEN STRL DISP B (DISPOSABLE) ×3 IMPLANT
TRAY FOLEY CATH 14FR (SET/KITS/TRAYS/PACK) IMPLANT
WATER STERILE IRR 1000ML POUR (IV SOLUTION) ×6 IMPLANT

## 2013-11-01 NOTE — Anesthesia Procedure Notes (Signed)
Procedure Name: Intubation Date/Time: 11/01/2013 11:15 AM Performed by: Alanda AmassFRIEDMAN, Vermelle Cammarata A Pre-anesthesia Checklist: Patient identified, Timeout performed, Emergency Drugs available, Suction available and Patient being monitored Patient Re-evaluated:Patient Re-evaluated prior to inductionOxygen Delivery Method: Circle system utilized Preoxygenation: Pre-oxygenation with 100% oxygen Intubation Type: IV induction Ventilation: Mask ventilation without difficulty Laryngoscope Size: Mac and 3 Grade View: Grade I Tube type: Oral Tube size: 7.5 mm Number of attempts: 1 Airway Equipment and Method: Stylet Placement Confirmation: ETT inserted through vocal cords under direct vision,  breath sounds checked- equal and bilateral and positive ETCO2 Secured at: 22 cm Tube secured with: Tape Dental Injury: Teeth and Oropharynx as per pre-operative assessment

## 2013-11-01 NOTE — ED Provider Notes (Signed)
Medical screening examination/treatment/procedure(s) were conducted as a shared visit with non-physician practitioner(s) and myself.  I personally evaluated the patient during the encounter.   EKG Interpretation   Date/Time:  Saturday October 31 2013 22:05:26 EDT Ventricular Rate:  66 PR Interval:  186 QRS Duration: 101 QT Interval:  412 QTC Calculation: 432 R Axis:   -52 Text Interpretation:  Sinus rhythm Atrial premature complexes LAD,  consider left anterior fascicular block No significant change since last  tracing Confirmed by Jaye Polidori  MD, Bernie Ransford ([id number]) on 10/31/2013 10:38:36 PM      I interviewed and examined the patient. Lungs are CTAB. Cardiac exam wnl. Abdomen soft. Normal sensation and 2+ pulses in LLE.  Will admit for hip fx.   Keith SheffieldForrest Tavin Vernet, MD 11/01/13 1225

## 2013-11-01 NOTE — Anesthesia Preprocedure Evaluation (Addendum)
Anesthesia Evaluation  Patient identified by MRN, date of birth, ID band Patient awake    Reviewed: Allergy & Precautions, H&P , NPO status , Patient's Chart, lab work & pertinent test results  History of Anesthesia Complications Negative for: history of anesthetic complications  Airway Mallampati: II TM Distance: >3 FB Neck ROM: Full    Dental  (+) Edentulous Upper, Edentulous Lower   Pulmonary shortness of breath and with exertion, neg sleep apnea, COPD COPD inhaler, neg recent URI,  breath sounds clear to auscultation        Cardiovascular hypertension, Pt. on medications - angina- Past MI + dysrhythmias Rhythm:Irregular     Neuro/Psych Seizures -, Well Controlled,  Last in the 80s negative psych ROS   GI/Hepatic Neg liver ROS, GERD-  ,  Endo/Other  diabetes, Well Controlled, Type 2, Oral Hypoglycemic Agents  Renal/GU negative Renal ROS     Musculoskeletal Left hip fracture   Abdominal   Peds  Hematology negative hematology ROS (+)   Anesthesia Other Findings   Reproductive/Obstetrics                       Anesthesia Physical Anesthesia Plan  ASA: III  Anesthesia Plan: General   Post-op Pain Management:    Induction: Intravenous  Airway Management Planned: Oral ETT  Additional Equipment: None  Intra-op Plan:   Post-operative Plan: Extubation in OR  Informed Consent: I have reviewed the patients History and Physical, chart, labs and discussed the procedure including the risks, benefits and alternatives for the proposed anesthesia with the patient or authorized representative who has indicated his/her understanding and acceptance.     Plan Discussed with: CRNA and Surgeon  Anesthesia Plan Comments:        Anesthesia Quick Evaluation

## 2013-11-01 NOTE — Progress Notes (Signed)
Orthopedic Tech Progress Note Patient Details:  Keith Wise Apr 21, 1940 [id number]  Ortho Devices Ortho Device/Splint Location: put overhead frame on bed Ortho Device/Splint Interventions: Ordered;Application   Jennye MoccasinHughes, [name] [name] 11/01/2013, 6:13 PM

## 2013-11-01 NOTE — Discharge Instructions (Signed)
Bear weight as tolerated.  Follow posterior hip precautions

## 2013-11-01 NOTE — Op Note (Signed)
10/31/2013 - 11/01/2013  12:11 PM  PATIENT:  Keith Wise   MRN: [id number]  PRE-OPERATIVE DIAGNOSIS:  left hip fracture  POST-OPERATIVE DIAGNOSIS:  left hip fracture  PROCEDURE:  Procedure(s): Left hip  Hemiarthroplasty  PREOPERATIVE INDICATIONS:  Keith Wise is an 73 y.o. male who was admitted 10/31/2013 with a diagnosis of Left displaced femoral neck fracture and elected for surgical management.  The risks benefits and alternatives were discussed with the patient including but not limited to the risks of nonoperative treatment, versus surgical intervention including infection, bleeding, nerve injury, periprosthetic fracture, the need for revision surgery, dislocation, leg length discrepancy, blood clots, cardiopulmonary complications, morbidity, mortality, among others, and they were willing to proceed.  Predicted outcome is good, although there will be at least a six to nine month expected recovery.   OPERATIVE REPORT     SURGEON:  Edmonia Lynch, MD    ASSISTANT:  Joya Gaskins OPA    ANESTHESIA:  General    COMPLICATIONS:  None.      COMPONENTS:  [employer] Acol[employer]: Femoral stem: 4, Femoral Head:49, Neck:0   PROCEDURE IN DETAIL: The patient was met in the holding area and identified.  The appropriate hip  was marked at the operative site. The patient was then transported to the OR and  placed under general anesthesia.  At that point, the patient was  placed in the lateral decubitus position with the operative side up and  secured to the operating room table and all bony prominences padded.     The operative lower extremity was prepped from the iliac crest to the toes.  Sterile draping was performed.  Time out was performed prior to incision.      A routine posterolateral approach was utilized via sharp dissection  carried down to the subcutaneous tissue.  Gross bleeders were Bovie  coagulated.  The iliotibial band was identified and incised  along the length of the skin incision.   Self-retaining retractors were  inserted.  With the hip internally rotated, the short external rotators  were identified. The piriformis was tagged with FiberWire, and the hip capsule released in a T-type fashion.  The femoral neck was exposed, and I resected the femoral neck using the appropriate jig. This was performed at approximately a thumb's breadth above the lesser trochanter.    I then exposed the deep acetabulum, cleared out any tissue including the ligamentum teres, and included the hip capsule in the FiberWire used above and below the T.    I then prepared the proximal femur using the cookie-cutter, the lateralizing reamer, and then sequentially broached.  A trial utilized, and I reduced the hip and it was found to have excellent stability with functional range of motion. The trial components were then removed.   The canal and acetabulum were thoroughly irrigated  I inserted the pressfit stem and placed the head and neck collar. The hip was reduced with appropriate force and was stable through a range of motion.   I then used a 2 mm drill bits to pass the FiberWire suture from the capsule and puriform is through the greater trochanter, and secured this. Excellent posterior capsular repair was achieved. I also closed the T in the capsule.  I then irrigated the hip copiously again with pulse lavage, and repaired the fascia with Vicryl, followed by Vicryl for the subcutaneous tissue, Monocryl for the skin, Steri-Strips and sterile gauze. The wounds were injected. The patient was then awakened and returned to PACU in  stable and satisfactory condition. There were no complications.  POST-OP PLAN: Weight bearing as tolerated. DVT px will consist of SCD's and ASA 325 daily  Edmonia Lynch, MD Orthopedic Surgeon [phone]   11/01/2013 12:11 PM

## 2013-11-01 NOTE — Progress Notes (Signed)
[facility] PROGRESS NOTE  JASSON SIEGMANN ZOX:[id number] DOB: 1940/05/18 DOA: 10/31/2013 PCP: Kaleen Mask, MD  Assessment/Plan:  Principal Problem:   Left displaced femoral neck fracture Active Problems:   Diabetes mellitus   Seizure disorder  Stable for OR  Code Status:  full Family Communication:  At bedside Disposition Plan:  Per PT/ortho  Consultants:  T. Murphy  HPI/Subjective: C/o intermittent pain. No other complaints  Objective: Filed Vitals:   11/01/13 0800  BP:   Pulse:   Temp:   Resp: 20    Intake/Output Summary (Last 24 hours) at 11/01/13 1003 Last data filed at 11/01/13 0502  Gross per 24 hour  Intake    500 ml  Output    625 ml  Net   -125 ml   Filed Weights   10/31/13 2039  Weight: 98.884 kg (218 lb)    Exam:   General:  Groggy. Oriented and cooperative  Cardiovascular: RRR without MGR.   Respiratory: CTA without WRR  Abdomen: s, nt, nd  Ext: no CCE  Basic Metabolic Panel:  Recent Labs Lab 10/31/13 2156  NA 136*  K 4.6  CL 100  CO2 21  GLUCOSE 118*  BUN 8  CREATININE 0.71  CALCIUM 9.0   Liver Function Tests: No results found for this basename: AST, ALT, ALKPHOS, BILITOT, PROT, ALBUMIN,  in the last 168 hours No results found for this basename: LIPASE, AMYLASE,  in the last 168 hours No results found for this basename: AMMONIA,  in the last 168 hours CBC:  Recent Labs Lab 10/31/13 2156  WBC 14.5*  NEUTROABS 11.7*  HGB 14.1  HCT 40.3  MCV 97.1  PLT 259   Cardiac Enzymes: No results found for this basename: CKTOTAL, CKMB, CKMBINDEX, TROPONINI,  in the last 168 hours BNP (last 3 results) No results found for this basename: PROBNP,  in the last 8760 hours CBG:  Recent Labs Lab 11/01/13 0053 11/01/13 0505 11/01/13 0832  GLUCAP 138* 129* 115*    No results found for this or any previous visit (from the past 240 hour(s)).   Studies: Dg Chest 1 View  10/31/2013   CLINICAL DATA:  Status post  fall.  Concern for chest injury.  EXAM: CHEST - 1 VIEW  COMPARISON:  Chest radiograph performed 04/15/2011  FINDINGS: The lungs are well-aerated. Pulmonary vascularity is at the upper limits of normal. There is no evidence of focal opacification, pleural effusion or pneumothorax. Minimal left-sided peripheral density is thought to reflect overlying soft tissues.  The cardiomediastinal silhouette is within normal limits. No acute osseous abnormalities are seen. Cervical spinal fusion hardware is noted. There is slight chronic irregularity of the distal left clavicle.  IMPRESSION: No acute cardiopulmonary process seen. No displaced rib fractures identified.   Electronically Signed   By: Roanna Raider M.D.   On: 10/31/2013 22:04   Dg Elbow Complete Left  11/01/2013   CLINICAL DATA:  Larey Seat, left elbow pain  EXAM: LEFT ELBOW - COMPLETE 3+ VIEW  COMPARISON:  None.  FINDINGS: There is no evidence of fracture, dislocation, or joint effusion. There is no evidence of arthropathy or other focal bone abnormality. Soft tissues are unremarkable.  IMPRESSION: Negative.   Electronically Signed   By: Esperanza Heir M.D.   On: 11/01/2013 00:27   Dg Hip Complete Left  10/31/2013   CLINICAL DATA:  Status post fall.  Left hip pain.  EXAM: LEFT HIP - COMPLETE 2+ VIEW  COMPARISON:  None.  FINDINGS: There is  a transcervical fracture through the left femoral neck, with mild superior displacement of the distal femur, and mild rotation of the femoral head. The left femoral head remains seated at the acetabulum.  The right hip joint is grossly unremarkable in appearance. No significant degenerative change is appreciated. The sacroiliac joints are unremarkable in appearance.  The visualized bowel gas pattern is grossly unremarkable in appearance.  IMPRESSION: Transcervical fracture through the left femoral neck, with mild superior displacement of the distal femur, and mild rotation of the femoral head.   Electronically Signed   By: Roanna RaiderJeffery   Chang M.D.   On: 10/31/2013 21:59   Dg Hand Complete Left  11/01/2013   CLINICAL DATA:  Fall, left hand pain  EXAM: LEFT HAND - COMPLETE 3+ VIEW  COMPARISON:  None.  FINDINGS: Diffuse osteopenia.  No fracture or dislocation.  IMPRESSION: No acute findings   Electronically Signed   By: Esperanza Heiraymond  Rubner M.D.   On: 11/01/2013 00:28    Scheduled Meds: . aspirin  325 mg Oral Daily  . insulin aspart  0-9 Units Subcutaneous 6 times per day  . lisinopril  20 mg Oral Daily  . mometasone-formoterol  2 puff Inhalation BID  . PHENobarbital  97.2 mg Oral QHS  . simvastatin  10 mg Oral q1800   Continuous Infusions:   Time spent: 25 minutes  Kirill Chatterjee L  [facility] Pager [phone]. If 7PM-7AM, please contact night-coverage at [email or web address], password [name] 11/01/2013, 10:03 AM  LOS: 1 day

## 2013-11-01 NOTE — Anesthesia Postprocedure Evaluation (Signed)
  Anesthesia Post-op Note  Patient: Keith Wise  Procedure(s) Performed: Procedure(s): Left hip  Hemiarthroplasty (Left)  Patient Location: PACU  Anesthesia Type:General  Level of Consciousness: awake  Airway and Oxygen Therapy: Patient Spontanous Breathing and Patient connected to nasal cannula oxygen  Post-op Pain: moderate  Post-op Assessment: Post-op Vital signs reviewed, Patient's Cardiovascular Status Stable, Respiratory Function Stable, Patent Airway, No signs of Nausea or vomiting and Pain level controlled  Post-op Vital Signs: Reviewed and stable  Last Vitals:  Filed Vitals:   11/01/13 1440  BP: 146/83  Pulse: 83  Temp: 36.8 C  Resp: 14    Complications: No apparent anesthesia complications

## 2013-11-01 NOTE — Transfer of Care (Signed)
Immediate Anesthesia Transfer of Care Note  Patient: Keith Wise  Procedure(s) Performed: Procedure(s): Left hip  Hemiarthroplasty (Left)  Patient Location: PACU  Anesthesia Type:General  Level of Consciousness: sedated  Airway & Oxygen Therapy: Patient Spontanous Breathing and Patient connected to nasal cannula oxygen  Post-op Assessment: Report given to PACU RN and Post -op Vital signs reviewed and stable  Post vital signs: Reviewed and stable  Complications: No apparent anesthesia complications

## 2013-11-02 ENCOUNTER — Encounter (HOSPITAL_COMMUNITY): Payer: Self-pay | Admitting: General Practice

## 2013-11-02 DIAGNOSIS — S72009A Fracture of unspecified part of neck of unspecified femur, initial encounter for closed fracture: Secondary | ICD-10-CM

## 2013-11-02 DIAGNOSIS — D62 Acute posthemorrhagic anemia: Secondary | ICD-10-CM | POA: Diagnosis not present

## 2013-11-02 DIAGNOSIS — W19XXXA Unspecified fall, initial encounter: Secondary | ICD-10-CM

## 2013-11-02 DIAGNOSIS — I509 Heart failure, unspecified: Secondary | ICD-10-CM

## 2013-11-02 DIAGNOSIS — I5032 Chronic diastolic (congestive) heart failure: Secondary | ICD-10-CM

## 2013-11-02 DIAGNOSIS — J449 Chronic obstructive pulmonary disease, unspecified: Secondary | ICD-10-CM

## 2013-11-02 DIAGNOSIS — D72829 Elevated white blood cell count, unspecified: Secondary | ICD-10-CM | POA: Diagnosis present

## 2013-11-02 LAB — PRO B NATRIURETIC PEPTIDE: Pro B Natriuretic peptide (BNP): 237.4 pg/mL — ABNORMAL HIGH (ref 0–125)

## 2013-11-02 LAB — BASIC METABOLIC PANEL
Anion gap: 15 (ref 5–15)
BUN: 9 mg/dL (ref 6–23)
CHLORIDE: 96 meq/L (ref 96–112)
CO2: 21 mEq/L (ref 19–32)
Calcium: 9 mg/dL (ref 8.4–10.5)
Creatinine, Ser: 0.78 mg/dL (ref 0.50–1.35)
GFR calc Af Amer: >90 mL/min (ref >90)
GFR calc non Af Amer: 89 mL/min — ABNORMAL LOW (ref >90)
Glucose, Bld: 205 mg/dL — ABNORMAL HIGH (ref 70–99)
Potassium: 4.3 mEq/L (ref 3.7–5.3)
Sodium: 132 mEq/L — ABNORMAL LOW (ref 137–147)

## 2013-11-02 LAB — GLUCOSE, CAPILLARY
GLUCOSE-CAPILLARY: 143 mg/dL — AB (ref 70–99)
GLUCOSE-CAPILLARY: 186 mg/dL — AB (ref 70–99)
Glucose-Capillary: 157 mg/dL — ABNORMAL HIGH (ref 70–99)
Glucose-Capillary: 193 mg/dL — ABNORMAL HIGH (ref 70–99)
Glucose-Capillary: 172 mg/dL — ABNORMAL HIGH (ref 70–99)
Glucose-Capillary: 156 mg/dL — ABNORMAL HIGH (ref 70–99)

## 2013-11-02 LAB — CBC
HCT: 38.2 % — ABNORMAL LOW (ref 39.0–52.0)
HEMOGLOBIN: 12.9 g/dL — AB (ref 13.0–17.0)
MCH: 32.7 pg (ref 26.0–34.0)
MCHC: 33.8 g/dL (ref 30.0–36.0)
MCV: 96.7 fL (ref 78.0–100.0)
Platelets: 224 10*3/uL (ref 150–400)
RBC: 3.95 MIL/uL — ABNORMAL LOW (ref 4.22–5.81)
RDW: 12.7 % (ref 11.5–15.5)
WBC: 15 10*3/uL — ABNORMAL HIGH (ref 4.0–10.5)

## 2013-11-02 LAB — HEMOGLOBIN A1C
Hgb A1c MFr Bld: 6.1 % — ABNORMAL HIGH (ref <5.7)
Mean Plasma Glucose: 128 mg/dL — ABNORMAL HIGH (ref <117)

## 2013-11-02 MED ORDER — PNEUMOCOCCAL VAC POLYVALENT 25 MCG/0.5ML IJ INJ
0.5000 mL | INJECTION | INTRAMUSCULAR | Status: AC
  Administered 2013-11-03: 0.5 mL via INTRAMUSCULAR
  Filled 2013-11-02: qty 0.5

## 2013-11-02 MED ORDER — INSULIN ASPART 100 UNIT/ML ~~LOC~~ SOLN: 0.0000 [IU] | Freq: Every day | SUBCUTANEOUS | Status: DC

## 2013-11-02 MED ORDER — INSULIN ASPART 100 UNIT/ML ~~LOC~~ SOLN
0.0000 [IU] | Freq: Three times a day (TID) | SUBCUTANEOUS | Status: DC
  Administered 2013-11-03: 2 [IU] via SUBCUTANEOUS

## 2013-11-02 MED ORDER — ADULT MULTIVITAMIN W/MINERALS CH
1.0000 | ORAL_TABLET | Freq: Every day | ORAL | Status: DC
  Administered 2013-11-02 – 2013-11-04 (×3): 1 via ORAL
  Filled 2013-11-02 (×3): qty 1

## 2013-11-02 MED ORDER — OXYCODONE HCL 5 MG PO TABS
5.0000 mg | ORAL_TABLET | ORAL | Status: DC | PRN
  Administered 2013-11-02 – 2013-11-04 (×8): 5 mg via ORAL
  Filled 2013-11-02 (×9): qty 1

## 2013-11-02 NOTE — Evaluation (Signed)
Physical Therapy Evaluation Patient Details Name: Keith Wise MRN: [id number] DOB: 1940/06/08 Today's Date: 11/02/2013   History of Present Illness  73 y.o. male s/p left hip hemiarthroplasty. Hx of DM, and HTN.  Clinical Impression  Patient is seen following the above procedure and presents with functional limitations due to the deficits listed below (see PT Problem List). All mobility was very taxing for patient this AM and he was in a great deal of pain despite coordinating pain medication schedule with therapy session. Mod assist +2 for transfer, reports history of LE weakness due to back injuries in the past. Arms fatigued very quickly while attempting gait training. Patient was independent prior to this incident and is highly motivated to improve his level of function. Do not feel patient will progress quickly enough to return home with HHPT from acute stay and will greatly benefit from intensive therapy in CIR vs SNF depending on his progression over the next day or two. Patient will benefit from skilled PT to increase their independence and safety with mobility to allow discharge to the venue listed below.      Follow Up Recommendations CIR    Equipment Recommendations  Rolling walker with 5" wheels;3in1 (PT)    Recommendations for Other Services Rehab consult     Precautions / Restrictions Precautions Precautions: Posterior Hip Precaution Booklet Issued: Yes (comment) Precaution Comments: Reviewed posterior hip precautions Restrictions Weight Bearing Restrictions: Yes LLE Weight Bearing: Weight bearing as tolerated      Mobility  Bed Mobility Overal bed mobility: Needs Assistance Bed Mobility: Supine to Sit     Supine to sit: Mod assist     General bed mobility comments: Mod assist for LLE support out of bed and to assist trunk to sitting position. Pt with difficulty pushing up from bed rail. VC for technique  Transfers Overall transfer level: Needs  assistance Equipment used: Rolling walker (2 wheeled) Transfers: Sit to/from Stand Sit to Stand: Mod assist;+2 physical assistance;From elevated surface         General transfer comment: Mod assist for boost to stand from slightly elevated bed surface. Took two attempts. VC for hand and foot placement. Poor control with descent to sit.  Ambulation/Gait Ambulation/Gait assistance: Mod assist;+2 safety/equipment Ambulation Distance (Feet): 4 Feet Assistive device: Rolling walker (2 wheeled) Gait Pattern/deviations: Step-to pattern;Decreased step length - left;Decreased step length - right;Decreased stance time - left;Decreased stride length;Decreased weight shift to left;Antalgic;Trunk flexed   Gait velocity interpretation: Below normal speed for age/gender General Gait Details: Mod assist for upright posture and walker placement. Pt with poor ability to bear weight through LLE. Arms fatigue rapidly. Educated on safe DME use with rolling walker and provided VC for precautions with turns to prevent internal rotation with LLE. Pre-gait training with weight shifting in standing and focused on posture for improved step mechanics.  Stairs            Wheelchair Mobility    Modified Rankin (Stroke Patients Only)       Balance Overall balance assessment: Needs assistance Sitting-balance support: Feet supported;No upper extremity supported Sitting balance-Leahy Scale: Fair     Standing balance support: Single extremity supported Standing balance-Leahy Scale: Poor                               Pertinent Vitals/Pain Pain Assessment: 0-10 Pain Score: 9  Pain Location: left hip Pain Descriptors / Indicators: Constant Pain Intervention(s): Limited activity  within patient's tolerance;Monitored during session;Premedicated before session;Repositioned    Home Living Family/patient expects to be discharged to:: Skilled nursing facility Living Arrangements:  Spouse/significant other Available Help at Discharge: Skilled Nursing Facility Type of Home: House Home Access: Stairs to enter Entrance Stairs-Rails: None Entrance Stairs-Number of Steps: 2 Home Layout: Two level;Able to live on main level with bedroom/bathroom;Laundry or work area in Nationwide Mutual Insurancebasement Home Equipment: Information systems managerhower seat      Prior Function Level of Independence: Independent               Higher education careers adviserHand Dominance   Dominant Hand: Right    Extremity/Trunk Assessment   Upper Extremity Assessment: Defer to OT evaluation           Lower Extremity Assessment: LLE deficits/detail   LLE Deficits / Details: Decreased strength and ROM as expected post op. Pt reports history of tingling in LEs bilaterally.     Communication   Communication: No difficulties  Cognition Arousal/Alertness: Awake/alert Behavior During Therapy: WFL for tasks assessed/performed Overall Cognitive Status: Within Functional Limits for tasks assessed                      General Comments General comments (skin integrity, edema, etc.): Reviewed posterior hip precautions with patient. Discussed d/c planning and importance of frequent mobility with therapeutic exercises during the day.    Exercises Total Joint Exercises Ankle Circles/Pumps: AROM;Both;10 reps;Seated Quad Sets: Strengthening;Both;10 reps;Seated Gluteal Sets: Strengthening;Both;10 reps;Seated      Assessment/Plan    PT Assessment Patient needs continued PT services  PT Diagnosis Difficulty walking;Abnormality of gait;Acute pain   PT Problem List Decreased strength;Decreased range of motion;Decreased activity tolerance;Decreased balance;Decreased mobility;Decreased knowledge of use of DME;Decreased knowledge of precautions;Pain  PT Treatment Interventions DME instruction;Gait training;Functional mobility training;Therapeutic activities;Therapeutic exercise;Balance training;Neuromuscular re-education;Patient/family education;Modalities    PT Goals (Current goals can be found in the Care Plan section) Acute Rehab PT Goals Patient Stated Goal: Get better PT Goal Formulation: With patient Time For Goal Achievement: 11/09/13 Potential to Achieve Goals: Good    Frequency Min 5X/week   Barriers to discharge        Co-evaluation               End of Session   Activity Tolerance: Patient limited by pain;Patient limited by fatigue Patient left: in chair;with call bell/phone within reach;with family/visitor present Nurse Communication: Mobility status         Time: [id number] PT Time Calculation (min): 37 min   Charges:   PT Evaluation $Initial PT Evaluation Tier I: 1 Procedure PT Treatments $Gait Training: 8-22 mins $Therapeutic Activity: 8-22 mins   PT G Codes:         Charlsie MerlesLogan Secor Kymani Shimabukuro, [place] CarolinaPT [phone]  Berton MountBarbour, Korby Ratay S 11/02/2013, 10:14 AM

## 2013-11-02 NOTE — Progress Notes (Signed)
INITIAL NUTRITION ASSESSMENT  DOCUMENTATION CODES Per approved criteria  -Obesity Unspecified   INTERVENTION: Encourage PO intake.  Provide MVI daily.  NUTRITION DIAGNOSIS: Inadequate oral intake related to decreased appetite as evidenced by meal completion 25%.   Goal: Pt to meet >/= 90% of their estimated nutrition needs.  Monitor:  PO intake, weight trends, labs, I/O's  Reason for Assessment: MD Consult  73 y.o. male  Admitting Dx: Left displaced femoral neck fracture  ASSESSMENT: Pt with PMH of DM, hypertension, hypercholesteremia, and COPD. Pt was playing football earlier this afternoon when he fell and landed on concrete. He fell on his Left side and had immediate left hip pain. Pt with a left hip fracture.  PROCEDURE: 8/9:  Left hip Hemiarthroplasty  Pt reports having a decreased appetite. Meal completion 25%. Pt reports he had a great appetite at home, eating 3 full meals a day. Pt reports over the past couple of days, he has had a lack of appetite. Oral supplements and snacks were offered to the pt, but pt refused. Pt denies any nausea, stomach pains, and difficulties chewing/swallowing. Pt was encouraged to eat at meals to help increase his calorie and protein intake. Pt expressed understanding.   Pt with no observed significant signs of fat and muscle mass depletion.   Height: Ht Readings from Last 1 Encounters:  10/31/13 5\' 9"  (1.753 m)    Weight: Wt Readings from Last 1 Encounters:  10/31/13 218 lb (98.884 kg)    Ideal Body Weight: 160 lbs  % Ideal Body Weight: 136%  Wt Readings from Last 10 Encounters:  10/31/13 218 lb (98.884 kg)  10/31/13 218 lb (98.884 kg)  04/30/11 226 lb (102.513 kg)  04/15/11 221 lb (100.245 kg)    Usual Body Weight: 218 lbs  % Usual Body Weight: 100%  BMI:  Body mass index is 32.18 kg/(m^2). Class I obesity  Estimated Nutritional Needs: Kcal: 2000-2200 Protein: 85-100 grams Fluid: 2 L - 2.2 L/day  Skin: Right  hip incision, non-pitting LE edema  Diet Order: Carb Control  EDUCATION NEEDS: -No education needs identified at this time   Intake/Output Summary (Last 24 hours) at 11/02/13 1434 Last data filed at 11/02/13 1300  Gross per 24 hour  Intake    900 ml  Output   1150 ml  Net   -250 ml    Last BM: 8/8   Labs:   Recent Labs Lab 10/31/13 2156  NA 136*  K 4.6  CL 100  CO2 21  BUN 8  CREATININE 0.71  CALCIUM 9.0  GLUCOSE 118*    CBG (last 3)   Recent Labs  11/02/13 0429 11/02/13 0810 11/02/13 1109  GLUCAP 157* 193* 172*    Scheduled Meds: . aspirin  325 mg Oral Daily  . insulin aspart  0-9 Units Subcutaneous 6 times per day  . lisinopril  20 mg Oral Daily  . mometasone-formoterol  2 puff Inhalation BID  . nicotine  21 mg Transdermal Daily  . PHENobarbital  97.2 mg Oral QHS  . pneumococcal 23 valent vaccine  0.5 mL Intramuscular Tomorrow-1000  . simvastatin  10 mg Oral q1800    Continuous Infusions:   Past Medical History  Diagnosis Date  . Diabetes mellitus   . Hypertension   . Hypercholesteremia   . COPD (chronic obstructive pulmonary disease)   . Shortness of breath   . Seizures     last seizure in the 80's     Past Surgical History  Procedure  Laterality Date  . Orthopedic surgery      lumbar, cervical  . Hemiarthroplasty hip Left 11/01/2013    dr Eulah Pontmurphy  . Cervical disc surgery    . Back surgery    . Hip arthroplasty Left 11/01/2013    Procedure: Left hip  Hemiarthroplasty;  Surgeon: Sheral Apleyimothy D Murphy, MD;  [facility];  Service: Orthopedics;  Laterality: Left;    Marijean NiemannStephanie La, MS, Provisional LDN Pager # [phone] After hours/ weekend pager # [phone]

## 2013-11-02 NOTE — Progress Notes (Signed)
    Subjective:  Patient reports pain as moderate  Objective:   VITALS:   Filed Vitals:   11/02/13 0426 11/02/13 0748 11/02/13 1123 11/02/13 1300  BP: 139/79   141/76  Pulse: 90   95  Temp: 99.8 F (37.7 C)   97.1 F (36.2 C)  TempSrc: Oral     Resp: 18 18 18 18   Height:      Weight:      SpO2: 92%   99%    Physical Exam   Dressing: C/D/I  Compartments soft  SILT DP/SP/S/S/T, 2+DP, +TA/GS/EHL  LABS  Results for orders placed during the hospital encounter of 10/31/13 (from the past 24 hour(s))  GLUCOSE, CAPILLARY     Status: Abnormal   Collection Time    11/01/13  4:57 PM      Result Value Ref Range   Glucose-Capillary 187 (*) 70 - 99 mg/dL  GLUCOSE, CAPILLARY     Status: Abnormal   Collection Time    11/01/13  8:15 PM      Result Value Ref Range   Glucose-Capillary 222 (*) 70 - 99 mg/dL  GLUCOSE, CAPILLARY     Status: Abnormal   Collection Time    11/02/13 12:47 AM      Result Value Ref Range   Glucose-Capillary 143 (*) 70 - 99 mg/dL  GLUCOSE, CAPILLARY     Status: Abnormal   Collection Time    11/02/13  4:29 AM      Result Value Ref Range   Glucose-Capillary 157 (*) 70 - 99 mg/dL  GLUCOSE, CAPILLARY     Status: Abnormal   Collection Time    11/02/13  8:10 AM      Result Value Ref Range   Glucose-Capillary 193 (*) 70 - 99 mg/dL   Comment 1 Notify RN    GLUCOSE, CAPILLARY     Status: Abnormal   Collection Time    11/02/13 11:09 AM      Result Value Ref Range   Glucose-Capillary 172 (*) 70 - 99 mg/dL   Comment 1 Notify RN       Assessment/Plan: 1 Day Post-Op   Principal Problem:   Left displaced femoral neck fracture Active Problems:   Diabetes mellitus   Hyperlipidemia   Seizure disorder   COPD (chronic obstructive pulmonary disease) with chronic bronchitis   Chronic diastolic congestive heart failure   PLAN: Weight Bearing: WBAT, post hip precautions Dressings: maintain until f/u VTE prophylaxis: ASA 325 Dispo: ok for dispo from  ortho standpoint.    Margarita RanaMURPHY, TIMOTHY, D 11/02/2013, 3:17 PM   Margarita Ranaimothy Murphy, MD Cell [phone]

## 2013-11-02 NOTE — Clinical Social Work Note (Signed)
Clinical Social Worker received referral for possible ST-SNF placement.  Chart reviewed.  PT/OT recommending inpatient rehab.  Spoke with RN Case Manager who will follow up with patient to discuss home health needs if patient denied for inpatient rehab.    CSW signing off - please re consult if social work needs arise.  Macario GoldsJesse Keiera Strathman, [place] [phone]

## 2013-11-02 NOTE — Consult Note (Signed)
Physical Medicine and Rehabilitation Consult Reason for Consult: Left displaced femoral neck fracture.  Referring Physician: Dr. Rito Ehrlich.    HPI: Keith Wise is a 73 y.o. male with history of DM type 2, DDD, seizure disorder, COPD who sustained a fall while playing football on 10/31/13 with sudden onset of left hip pain with inability to bear weight LLE. He was admitted for work up and found to have displaced left femoral neck fracture. He was evaluated by Dr. Eulah Pont and underwent left hip hemi on 11/01/13. Post op to be WBAT and continues on ASA for DVT prophylaxis. Noted to have mild hyponatremia as well as leucocytosis on admission labs. PT evaluation done and today and patient noted to have difficulty with weight bearing thorough LLE as well as poor endurance. CIR recommended by MD and PT.   Patient sleeping after pain medication. No significant pain at rest. Complains of severe pain when he is up with therapy Review of Systems  HENT: Negative for hearing loss.   Eyes: Negative for blurred vision and double vision.  Respiratory: Positive for cough. Negative for shortness of breath and wheezing.   Cardiovascular: Negative for chest pain and palpitations.  Gastrointestinal: Negative for heartburn, nausea and abdominal pain.  Musculoskeletal: Positive for joint pain and myalgias.  Neurological: Positive for dizziness (this am) and weakness. Negative for headaches.  Psychiatric/Behavioral: The patient is nervous/anxious.     Past Medical History  Diagnosis Date  . Diabetes mellitus   . Hypertension   . Hypercholesteremia   . COPD (chronic obstructive pulmonary disease)   . Shortness of breath   . Seizures     last seizure in the 80's    Past Surgical History  Procedure Laterality Date  . Orthopedic surgery      lumbar, cervical  . Hemiarthroplasty hip Left 11/01/2013    dr Eulah Pont  . Cervical disc surgery    . Back surgery    . Hip arthroplasty Left 11/01/2013   Procedure: Left hip  Hemiarthroplasty;  Surgeon: Sheral Apley, MD;  Location: [facility];  Service: Orthopedics;  Laterality: Left;   Family History  Problem Relation Age of Onset  . Diabetes type II     Social History:  Jennelle Human works second shift. Independent without AD. Disabled due to seizures. He reports that he has been smoking Cigarettes--3 PPD.   He has a 110 pack-year smoking history. He has never used smokeless tobacco. He reports that he does not drink alcohol or use illicit drugs.  Allergies: No Known Allergies  Medications Prior to Admission  Medication Sig Dispense Refill  . aspirin 325 MG tablet Take 325 mg by mouth daily.      . colchicine 0.6 MG tablet Take 0.6 mg by mouth daily as needed (for gout).       Marland Kitchen glimepiride (AMARYL) 2 MG tablet Take 2 mg by mouth daily with breakfast.      . lisinopril (PRINIVIL,ZESTRIL) 20 MG tablet Take 20 mg by mouth daily.      . metFORMIN (GLUCOPHAGE) 1000 MG tablet Take 1,000 mg by mouth at bedtime.       Marland Kitchen PHENobarbital (LUMINAL) 97.2 MG tablet Take 97.2 mg by mouth at bedtime.      . pravastatin (PRAVACHOL) 40 MG tablet Take 40 mg by mouth daily.      . sildenafil (VIAGRA) 100 MG tablet Take 100 mg by mouth daily as needed for erectile dysfunction.       Marland Kitchen  HYDROcodone-acetaminophen (NORCO/VICODIN) 5-325 MG per tablet Take 1 tablet by mouth every 6 (six) hours as needed for moderate pain.      Marland Kitchen ibuprofen (ADVIL,MOTRIN) 200 MG tablet Take 400 mg by mouth every morning.        Home: Home Living Family/patient expects to be discharged to:: Inpatient rehab Living Arrangements: Spouse/significant other Available Help at Discharge: Family Type of Home: House Home Access: Stairs to enter [employer] of Steps: 2 Entrance Stairs-Rails: None Home Layout: Two level;Able to live on main level with bedroom/bathroom;Laundry or work area in [employer] Equipment: Tub bench  Functional History: Prior Function Level of  Independence: Independent Functional Status:  Mobility: Bed Mobility Overal bed mobility: Needs Assistance Bed Mobility: Supine to Sit Supine to sit: Mod assist General bed mobility comments: Mod assist for LLE support out of bed and to assist trunk to sitting position. Pt with difficulty pushing up from bed rail. VC for technique Transfers Overall transfer level: Needs assistance Equipment used: Rolling walker (2 wheeled) Transfers: Sit to/from Stand Sit to Stand: Mod assist;+2 physical assistance;From elevated surface General transfer comment: Mod assist for boost to stand from slightly elevated bed surface. Took two attempts. VC for hand and foot placement. Poor control with descent to sit. Ambulation/Gait Ambulation/Gait assistance: Mod assist;+2 safety/equipment Ambulation Distance (Feet): 4 Feet Assistive device: Rolling walker (2 wheeled) Gait Pattern/deviations: Step-to pattern;Decreased step length - left;Decreased step length - right;Decreased stance time - left;Decreased stride length;Decreased weight shift to left;Antalgic;Trunk flexed Gait velocity interpretation: Below normal speed for age/gender General Gait Details: Mod assist for upright posture and walker placement. Pt with poor ability to bear weight through LLE. Arms fatigue rapidly. Educated on safe DME use with rolling walker and provided VC for precautions with turns to prevent internal rotation with LLE. Pre-gait training with weight shifting in standing and focused on posture for improved step mechanics.    ADL: ADL Overall ADL's : Needs assistance/impaired Eating/Feeding: Independent;Sitting Grooming: Set up;Sitting Upper Body Bathing: Set up;Sitting Lower Body Bathing: Maximal assistance (with +2 mod A sit<>stand) Upper Body Dressing : Minimal assistance;Sitting Lower Body Dressing: Total assistance (with +2 Mod A sit<>stand) Toilet Transfer: Moderate assistance;+2 for physical assistance;Stand-pivot;RW  (recliner>bed (going to patient's right)) Toileting- Clothing Manipulation and Hygiene: Total assistance (with +2 Mod A sit<>stand)  Cognition: Cognition Overall Cognitive Status: Within Functional Limits for tasks assessed Orientation Level: Oriented X4 Cognition Arousal/Alertness: Awake/alert Behavior During Therapy: WFL for tasks assessed/performed Overall Cognitive Status: Within Functional Limits for tasks assessed  Blood pressure 139/79, pulse 90, temperature 99.8 F (37.7 C), temperature source Oral, resp. rate 18, height 5\' 9"  (1.753 m), weight 98.884 kg (218 lb), SpO2 92.00%. Physical Exam  Nursing note and vitals reviewed. Constitutional: He is oriented to person, place, and time. He appears well-developed and well-nourished.  HENT:  Head: Normocephalic and atraumatic.  Eyes: Conjunctivae are normal. Pupils are equal, round, and reactive to light.  Neck: Normal range of motion. Neck supple.  Cardiovascular: Normal rate and regular rhythm.   Respiratory: Effort normal. No respiratory distress. He has no wheezes. He has rhonchi.  GI: Soft. Bowel sounds are normal. He exhibits no distension. There is no tenderness.  Musculoskeletal:  LLE limited due to pain inhibition.   Neurological: He is alert and oriented to person, place, and time.  Skin: Skin is warm and dry.  Psychiatric: He has a normal mood and affect. His behavior is normal. Judgment and thought content normal.   upper extremity strength 5/5 bilateral  deltoid, bicep and tricep, grip Right lower extremity strength is 4/5 hip flexor knee extensor ankle dorsiflexor. He complains of back pain when he moves his right leg. Left lower extremity strength trace hip flexion and knee extension 2 minus ankle dorsiflexion and plantarflexion all inhibited by pain Sensation is intact to light touch in bilateral upper and lower limbs Musculoskeletal: Left second and third MCP swelling and tenderness as well as palm RN dorsal hand  tenderness  Results for orders placed during the hospital encounter of 10/31/13 (from the past 24 hour(s))  GLUCOSE, CAPILLARY     Status: Abnormal   Collection Time    11/01/13 12:51 PM      Result Value Ref Range   Glucose-Capillary 161 (*) 70 - 99 mg/dL   Comment 1 Documented in Chart     Comment 2 Notify RN    GLUCOSE, CAPILLARY     Status: Abnormal   Collection Time    11/01/13  4:57 PM      Result Value Ref Range   Glucose-Capillary 187 (*) 70 - 99 mg/dL  GLUCOSE, CAPILLARY     Status: Abnormal   Collection Time    11/01/13  8:15 PM      Result Value Ref Range   Glucose-Capillary 222 (*) 70 - 99 mg/dL  GLUCOSE, CAPILLARY     Status: Abnormal   Collection Time    11/02/13 12:47 AM      Result Value Ref Range   Glucose-Capillary 143 (*) 70 - 99 mg/dL  GLUCOSE, CAPILLARY     Status: Abnormal   Collection Time    11/02/13  4:29 AM      Result Value Ref Range   Glucose-Capillary 157 (*) 70 - 99 mg/dL  GLUCOSE, CAPILLARY     Status: Abnormal   Collection Time    11/02/13  8:10 AM      Result Value Ref Range   Glucose-Capillary 193 (*) 70 - 99 mg/dL   Comment 1 Notify RN     Dg Chest 1 View  10/31/2013   CLINICAL DATA:  Status post fall.  Concern for chest injury.  EXAM: CHEST - 1 VIEW  COMPARISON:  Chest radiograph performed 04/15/2011  FINDINGS: The lungs are well-aerated. Pulmonary vascularity is at the upper limits of normal. There is no evidence of focal opacification, pleural effusion or pneumothorax. Minimal left-sided peripheral density is thought to reflect overlying soft tissues.  The cardiomediastinal silhouette is within normal limits. No acute osseous abnormalities are seen. Cervical spinal fusion hardware is noted. There is slight chronic irregularity of the distal left clavicle.  IMPRESSION: No acute cardiopulmonary process seen. No displaced rib fractures identified.   Electronically Signed   By: Roanna RaiderJeffery  Chang M.D.   On: 10/31/2013 22:04   Dg Elbow Complete  Left  11/01/2013   CLINICAL DATA:  Larey SeatFell, left elbow pain  EXAM: LEFT ELBOW - COMPLETE 3+ VIEW  COMPARISON:  None.  FINDINGS: There is no evidence of fracture, dislocation, or joint effusion. There is no evidence of arthropathy or other focal bone abnormality. Soft tissues are unremarkable.  IMPRESSION: Negative.   Electronically Signed   By: Esperanza Heiraymond  Rubner M.D.   On: 11/01/2013 00:27   Dg Hip Complete Left  11/01/2013   CLINICAL DATA:  73 year old male status post left hip hemiarthroplasty  EXAM: LEFT HIP - COMPLETE 2+ VIEW  COMPARISON:  None.  FINDINGS: Left hip hemiarthroplasty identified.  No fracture, subluxation or dislocation noted.  No complicating features identified.  IMPRESSION: Left hip hemiarthroplasty without complicating features.   Electronically Signed   By: Laveda Abbe M.D.   On: 11/01/2013 14:02   Dg Hip Complete Left  10/31/2013   CLINICAL DATA:  Status post fall.  Left hip pain.  EXAM: LEFT HIP - COMPLETE 2+ VIEW  COMPARISON:  None.  FINDINGS: There is a transcervical fracture through the left femoral neck, with mild superior displacement of the distal femur, and mild rotation of the femoral head. The left femoral head remains seated at the acetabulum.  The right hip joint is grossly unremarkable in appearance. No significant degenerative change is appreciated. The sacroiliac joints are unremarkable in appearance.  The visualized bowel gas pattern is grossly unremarkable in appearance.  IMPRESSION: Transcervical fracture through the left femoral neck, with mild superior displacement of the distal femur, and mild rotation of the femoral head.   Electronically Signed   By: Roanna Raider M.D.   On: 10/31/2013 21:59   Dg Hand Complete Left  11/01/2013   CLINICAL DATA:  Fall, left hand pain  EXAM: LEFT HAND - COMPLETE 3+ VIEW  COMPARISON:  None.  FINDINGS: Diffuse osteopenia.  No fracture or dislocation.  IMPRESSION: No acute findings   Electronically Signed   By: Esperanza Heir M.D.   On:  11/01/2013 00:28    Assessment/Plan: Diagnosis: L FNF POD#1 Left hip hemiarthroplasty 1. Does the need for close, 24 hr/day medical supervision in concert with the patient's rehab needs make it unreasonable for this patient to be served in a less intensive setting? Yes 2. Co-Morbidities requiring supervision/potential complications: DM,CHF,Seizure D/O 3. Due to bladder management, bowel management, safety, skin/wound care, disease management, medication administration, pain management and patient education, does the patient require 24 hr/day rehab nursing? Yes 4. Does the patient require coordinated care of a physician, rehab nurse, PT (1-2 hrs/day, 5 days/week) and OT (1-2 hrs/day, 5 days/week) to address physical and functional deficits in the context of the above medical diagnosis(es)? Yes Addressing deficits in the following areas: balance, endurance, locomotion, strength, transferring, bowel/bladder control, bathing, dressing, feeding, grooming and toileting 5. Can the patient actively participate in an intensive therapy program of at least 3 hrs of therapy per day at least 5 days per week? Yes 6. The potential for patient to make measurable gains while on inpatient rehab is excellent 7. Anticipated functional outcomes upon discharge from inpatient rehab are supervision  with PT, supervision with OT, n/a with SLP. 8. Estimated rehab length of stay to reach the above functional goals is: 7-10 days 9. Does the patient have adequate social supports to accommodate these discharge functional goals? Yes 10. Anticipated D/C setting: Home 11. Anticipated post D/C treatments: HH therapy 12. Overall Rehab/Functional Prognosis: good  RECOMMENDATIONS: This patient's condition is appropriate for continued rehabilitative care in the following setting: CIR Patient has agreed to participate in recommended program. Yes Note that insurance prior authorization may be required for reimbursement for  recommended care.  Comment: should be ready post op day 2 or 3    11/02/2013

## 2013-11-02 NOTE — Evaluation (Signed)
Occupational Therapy Evaluation and Discharge (with deferral of remaining therapy to IP rehab venue) Patient Details Name: Keith Wise MRN: [id number] DOB: [birth year] Today's Date: 11/02/2013    History of Present Illness 73 y.o. male s/p left hip hemiarthroplasty. Hx of DM, and HTN.   Clinical Impression   This 73 yo male admitted and underwent above presents to acute OT with increased pain, decreased mobility, decreased balance, and posterior hip precautions all affecting the patient's ability to care for himself and for his wife to care for him. He will benefit from inpatient rehab to get to a S/ Mod I level to go home with wife.    Follow Up Recommendations  CIR    Equipment Recommendations  3 in 1 bedside comode       Precautions / Restrictions Precautions Precautions: Posterior Hip Precaution Booklet Issued: Yes (comment) Precaution Comments: Reviewed posterior hip precautions Restrictions Weight Bearing Restrictions: No LLE Weight Bearing: Weight bearing as tolerated      Mobility Bed Mobility Overal bed mobility: Needs Assistance Bed Mobility: Sit to Supine     Sit to supine: Max assist;+2 for physical assistance    Transfers Overall transfer level: Needs assistance Equipment used: Rolling walker (2 wheeled) Transfers: Sit to/from Stand Sit to Stand: Mod assist;+2 physical assistance         General transfer comment: VCs for safe hand placement    Balance Overall balance assessment: Needs assistance Sitting-balance support: Feet supported;No upper extremity supported Sitting balance-Leahy Scale: Fair     Standing balance support: Bilateral upper extremity supported Standing balance-Leahy Scale: Poor                              ADL Overall ADL's : Needs assistance/impaired Eating/Feeding: Independent;Sitting   Grooming: Set up;Sitting   Upper Body Bathing: Set up;Sitting   Lower Body Bathing: Maximal assistance (with +2 mod A  sit<>stand)   Upper Body Dressing : Minimal assistance;Sitting   Lower Body Dressing: Total assistance (with +2 Mod A sit<>stand)   Toilet Transfer: Moderate assistance;+2 for physical assistance;Stand-pivot;RW (recliner>bed (going to patient's right))   Toileting- Clothing Manipulation and Hygiene: Total assistance (with +2 Mod A sit<>stand)                         Pertinent Vitals/Pain Pain Assessment: 0-10 Pain Score: 0-No pain Pain Location: left hip Pain Descriptors / Indicators: Constant Pain Intervention(s): Limited activity within patient's tolerance;Patient requesting pain meds-RN notified     Hand Dominance Right   Extremity/Trunk Assessment Upper Extremity Assessment Upper Extremity Assessment: Overall WFL for tasks assessed      Communication Communication Communication: No difficulties   Cognition Arousal/Alertness: Awake/alert Behavior During Therapy: WFL for tasks assessed/performed Overall Cognitive Status: Within Functional Limits for tasks assessed                                Home Living Family/patient expects to be discharged to:: Inpatient rehab Living Arrangements: Spouse/significant other Available Help at Discharge: Family Type of Home: House Home Access: Stairs to enter Entergy CorporationEntrance Stairs-Number of Steps: 2 Entrance Stairs-Rails: None Home Layout: Two level;Able to live on main level with bedroom/bathroom;Laundry or work area in Fifth Third Bancorpbasement     Bathroom Shower/Tub: Tub/shower unit;Door Shower/tub characteristics: Sport and exercise psychologistDoor Bathroom Toilet: Standard     Home Equipment: Tub bench  Prior Functioning/Environment Level of Independence: Independent             OT Diagnosis: Generalized weakness;Acute pain   OT Problem List: Decreased strength;Decreased range of motion;Decreased activity tolerance;Impaired balance (sitting and/or standing);Pain;Obesity;Decreased knowledge of use of DME or AE      OT  Goals(Current goals can be found in the care plan section) Acute Rehab OT Goals Patient Stated Goal: rehab then home  OT Frequency:                End of Session Equipment Utilized During Treatment: Gait belt;Rolling walker Nurse Communication: Patient requests pain meds  Activity Tolerance: Patient limited by pain Patient left: in bed;with call bell/phone within reach;with family/visitor present   Time: 1046-1105 OT Time Calculation (min): 19 min Charges:  OT General Charges $OT Visit: 1 Procedure OT Evaluation $Initial OT Evaluation Tier I: 1 Procedure OT Treatments $Self Care/Home Management : 8-22 mins  Evette Georges [phone] 11/02/2013, 11:16 AM

## 2013-11-02 NOTE — Progress Notes (Signed)
Rehab Admissions Coordinator Note:  Patient was screened by Trish MageLogue, Naquita Nappier M for appropriateness for an Inpatient Acute Rehab Consult.  At this time, we are recommending Inpatient Rehab consult.  Lelon FrohlichLogue, Markeita Alicia M 11/02/2013, 10:40 AM  I can be reached at [phone].

## 2013-11-02 NOTE — Progress Notes (Signed)
PROGRESS NOTE  Keith Wise MVH:[id number] DOB: 05/16/1940 DOA: 10/31/2013 PCP: [name],[name], MD  HPI/Recap of past 6324 hours: 73 year old white male past medical history diastolic heart failure and COPD plus diabetes who sustained a mechanical fall and a secondary left displaced femoral neck fracture. Cleared for surgery and underwent left hip hemiarthroplasty 8/9 without complications.  Assessment/Plan: Principal Problem:   Left displaced femoral neck fracture: Patient felt to be good candidate for inpatient rehabilitation. We'll check followup labs looking for blood loss anemia Active Problems:   Diabetes mellitus: Not specified as uncontrolled. CBGs slightly elevated. Check A1c. Change sliding scale to 3 times a day, a.c. at bedtime. Holding oral hypoglycemics.    Hyperlipidemia: Stable. Continue statin.    Seizure disorder: Stable. Continued on home phenobarbital dose.    COPD (chronic obstructive pulmonary disease) with chronic bronchitis: Currently stable.    Chronic diastolic congestive heart failure noted incidentally on echocardiogram. Checking BNP.   Code Status: Full code  Family Communication: Wife at the bedside  Disposition Plan: Followup pending labs. Awaiting approval for inpatient rehabilitation  Consultants:  Orthopedics  Physical medicine and rehabilitation  Procedures:  Status post arthroplasty done 8/9  Antibiotics:  None  HPI/Subjective: Patient doing okay. Complains of some soreness at the site of surgery, especially with any kind of movement. Otherwise doing well. Denies any chest pain or shortness of breath.  Objective: BP 141/76  Pulse 95  Temp(Src) 97.1 F (36.2 C) (Oral)  Resp 18  Ht 5\' 9"  (1.753 m)  Wt 98.884 kg (218 lb)  BMI 32.18 kg/m2  SpO2 99%  Intake/Output Summary (Last 24 hours) at 11/02/13 1441 Last data filed at 11/02/13 1300  Gross per 24 hour  Intake    900 ml  Output   1150 ml  Net   -250 ml   Filed  Weights   10/31/13 2039  Weight: 98.884 kg (218 lb)    Exam:   General:  Alert and oriented x3, no acute distress  Cardiovascular: Regular rate and rhythm, S1-S2  Respiratory: Clear to auscultation bilaterally  Abdomen: Soft, nontender, nondistended, positive bowel sounds  Musculoskeletal: No clubbing or cyanosis, trace edema   Data Reviewed: Basic Metabolic Panel:  Recent Labs Lab 10/31/13 2156  NA 136*  K 4.6  CL 100  CO2 21  GLUCOSE 118*  BUN 8  CREATININE 0.71  CALCIUM 9.0   CBC:  Recent Labs Lab 10/31/13 2156  WBC 14.5*  NEUTROABS 11.7*  HGB 14.1  HCT 40.3  MCV 97.1  PLT 259   Cardiac Enzymes:   No results found for this basename: CKTOTAL, CKMB, CKMBINDEX, TROPONINI,  in the last 168 hours BNP (last 3 results) No results found for this basename: PROBNP,  in the last 8760 hours CBG:  Recent Labs Lab 11/01/13 2015 11/02/13 0047 11/02/13 0429 11/02/13 0810 11/02/13 1109  GLUCAP 222* 143* 157* 193* 172*    No results found for this or any previous visit (from the past 240 hour(s)).   Studies: Dg Elbow Complete Left  11/01/2013   CLINICAL DATA:  Larey SeatFell, left elbow pain  EXAM: LEFT ELBOW - COMPLETE 3+ VIEW  COMPARISON:  None.  FINDINGS: There is no evidence of fracture, dislocation, or joint effusion. There is no evidence of arthropathy or other focal bone abnormality. Soft tissues are unremarkable.  IMPRESSION: Negative.   Electronically Signed   By: Esperanza Heiraymond  Rubner M.D.   On: 11/01/2013 00:27   Dg Hip Complete Left  11/01/2013  CLINICAL DATA:  73 year old male status post left hip hemiarthroplasty  EXAM: LEFT HIP - COMPLETE 2+ VIEW  COMPARISON:  None.  FINDINGS: Left hip hemiarthroplasty identified.  No fracture, subluxation or dislocation noted.  No complicating features identified.  IMPRESSION: Left hip hemiarthroplasty without complicating features.   Electronically Signed   By: Laveda Abbe M.D.   On: 11/01/2013 14:02   Dg Hand Complete  Left  11/01/2013   CLINICAL DATA:  Fall, left hand pain  EXAM: LEFT HAND - COMPLETE 3+ VIEW  COMPARISON:  None.  FINDINGS: Diffuse osteopenia.  No fracture or dislocation.  IMPRESSION: No acute findings   Electronically Signed   By: Esperanza Heir M.D.   On: 11/01/2013 00:28    Scheduled Meds: . aspirin  325 mg Oral Daily  . insulin aspart  0-9 Units Subcutaneous 6 times per day  . lisinopril  20 mg Oral Daily  . mometasone-formoterol  2 puff Inhalation BID  . nicotine  21 mg Transdermal Daily  . PHENobarbital  97.2 mg Oral QHS  . pneumococcal 23 valent vaccine  0.5 mL Intramuscular Tomorrow-1000  . simvastatin  10 mg Oral q1800    Continuous Infusions:    Time spent: 25 minutes  Hollice Espy  [facility] Pager [phone]. If 7PM-7AM, please contact night-coverage at [email or web address], password [facility] 11/02/2013, 2:41 PM  LOS: 2 days

## 2013-11-02 NOTE — Progress Notes (Signed)
Note reviewed. Agree with assessment and intervention. Ian Malkineanne Barnett RD, LDN Inpatient Clinical Dietitian Pager: [phone] After Hours Pager: [phone]

## 2013-11-02 NOTE — Progress Notes (Signed)
Utilization review completed.  

## 2013-11-02 NOTE — Care Management Note (Signed)
CARE MANAGEMENT NOTE 11/02/2013  Patient:  Keith Wise,Keith Wise   Account Number:  [id number]  Date Initiated:  11/02/2013  Documentation initiated by:  Vance PeperBRADY,Florie Carico  Subjective/Objective Assessment:   73 yr old male s/p left hip hemiarthroplasty.     Action/Plan:   PT/OT eval.  Patient also being evaluated by CIR. Case manager will follow.   Anticipated DC Date:     Anticipated DC Plan:  IP REHAB FACILITY         Choice offered to / List presented to:             Status of service:  In process, will continue to follow  Per UR Regulation:  Reviewed for med. necessity/level of care/duration of stay

## 2013-11-03 DIAGNOSIS — E669 Obesity, unspecified: Secondary | ICD-10-CM | POA: Diagnosis present

## 2013-11-03 DIAGNOSIS — D62 Acute posthemorrhagic anemia: Secondary | ICD-10-CM

## 2013-11-03 DIAGNOSIS — D72829 Elevated white blood cell count, unspecified: Secondary | ICD-10-CM

## 2013-11-03 LAB — BASIC METABOLIC PANEL
Anion gap: 14 (ref 5–15)
BUN: 8 mg/dL (ref 6–23)
CHLORIDE: 94 meq/L — AB (ref 96–112)
CO2: 22 mEq/L (ref 19–32)
Calcium: 9.1 mg/dL (ref 8.4–10.5)
Creatinine, Ser: 0.7 mg/dL (ref 0.50–1.35)
GLUCOSE: 157 mg/dL — AB (ref 70–99)
Potassium: 4.3 mEq/L (ref 3.7–5.3)
SODIUM: 130 meq/L — AB (ref 137–147)

## 2013-11-03 LAB — CBC
HEMATOCRIT: 36.7 % — AB (ref 39.0–52.0)
HEMOGLOBIN: 12.8 g/dL — AB (ref 13.0–17.0)
MCH: 33.5 pg (ref 26.0–34.0)
MCHC: 34.9 g/dL (ref 30.0–36.0)
MCV: 96.1 fL (ref 78.0–100.0)
Platelets: 216 10*3/uL (ref 150–400)
RBC: 3.82 MIL/uL — ABNORMAL LOW (ref 4.22–5.81)
RDW: 12.7 % (ref 11.5–15.5)
WBC: 14.2 10*3/uL — AB (ref 4.0–10.5)

## 2013-11-03 LAB — GLUCOSE, CAPILLARY
Glucose-Capillary: 146 mg/dL — ABNORMAL HIGH (ref 70–99)
Glucose-Capillary: 182 mg/dL — ABNORMAL HIGH (ref 70–99)
Glucose-Capillary: 245 mg/dL — ABNORMAL HIGH (ref 70–99)
Glucose-Capillary: 176 mg/dL — ABNORMAL HIGH (ref 70–99)

## 2013-11-03 MED ORDER — INSULIN ASPART 100 UNIT/ML ~~LOC~~ SOLN: 0.0000 [IU] | Freq: Every day | SUBCUTANEOUS | Status: DC

## 2013-11-03 MED ORDER — INSULIN ASPART 100 UNIT/ML ~~LOC~~ SOLN
0.0000 [IU] | Freq: Three times a day (TID) | SUBCUTANEOUS | Status: DC
  Administered 2013-11-03: 7 [IU] via SUBCUTANEOUS
  Administered 2013-11-04: 4 [IU] via SUBCUTANEOUS
  Administered 2013-11-04: 3 [IU] via SUBCUTANEOUS

## 2013-11-03 NOTE — Progress Notes (Signed)
Physical Therapy Treatment Patient Details Name: Keith Wise MRN: [id number] DOB: 1940/09/06 Today's Date: 11/03/2013    History of Present Illness 73 y.o. male s/p left hip hemiarthroplasty. Hx of DM, and HTN.    PT Comments    Patient is progressing slowly towards physical therapy goals, limited by pain and fatigue today. Increased difficulty following directions and altered attention during therapy. Mod assist +2 for most mobility. Updated recommendation for d/c to SNF. Patient will continue to benefit from skilled physical therapy services to further improve independence with functional mobility.    Follow Up Recommendations  SNF;Supervision for mobility/OOB     Equipment Recommendations  Rolling walker with 5" wheels;3in1 (PT)    Recommendations for Other Services       Precautions / Restrictions Precautions Precautions: Posterior Hip Precaution Booklet Issued: Yes (comment) Precaution Comments: Reviewed posterior hip precautions Restrictions Weight Bearing Restrictions: Yes LLE Weight Bearing: Weight bearing as tolerated    Mobility  Bed Mobility Overal bed mobility: Needs Assistance Bed Mobility: Supine to Sit     Supine to sit: Mod assist;HOB elevated Sit to supine: Mod assist   General bed mobility comments: Mod assist for LLE support out of bed and trunk support. Heavy use of bed rail. requires extra time and considerable effort to come to edge of bed. VC for technique.  Transfers Overall transfer level: Needs assistance Equipment used: Rolling walker (2 wheeled) Transfers: Sit to/from Stand Sit to Stand: Mod assist;From elevated surface         General transfer comment: Educated on technique with hand, foot placement and forward weight shift. Attempted from lowest bed setting and unable to perform. Elevated surface required mod assist for boost with poor stability upon standing.  Ambulation/Gait Ambulation/Gait assistance: Mod assist;+2  safety/equipment Ambulation Distance (Feet): 2 Feet Assistive device: Rolling walker (2 wheeled) Gait Pattern/deviations: Step-to pattern;Decreased step length - right;Decreased step length - left;Decreased stance time - left;Decreased stride length;Shuffle;Antalgic;Wide base of support;Trunk flexed   Gait velocity interpretation: Below normal speed for age/gender General Gait Details: Mod assist for balance and upright posture. Max VC for technique and sequencing.Only able to tolerate short distance of 2 approximately 2 feet and small turn. Required second person to bring chair behind pt. Poor ability to bear weight through LLE, Limited ability to bearing weight through UEs with rapid fatigue. Pre-gait tasks with static stability and weight shifting.   Stairs            Wheelchair Mobility    Modified Rankin (Stroke Patients Only)       Balance                                    Cognition Arousal/Alertness: Awake/alert Behavior During Therapy: WFL for tasks assessed/performed Overall Cognitive Status: Impaired/Different from baseline Area of Impairment: Following commands;Attention;Memory   Current Attention Level: Selective Memory: Decreased recall of precautions;Decreased short-term memory Following Commands: Follows one step commands inconsistently       General Comments: Pt with increased difficulty following commands compared to previous therapy session. Altered attention during therapy.    Exercises Total Joint Exercises Ankle Circles/Pumps: AROM;Both;10 reps;Seated Quad Sets: Strengthening;Both;10 reps;Seated Gluteal Sets: Strengthening;Both;10 reps;Seated    General Comments General comments (skin integrity, edema, etc.): Reviewed posterior hip precautions - able to recall 0/3.      Pertinent Vitals/Pain Pain Assessment: 0-10 Pain Score: 9  Pain Location: left hip Pain Descriptors /  Indicators: Constant Pain Intervention(s): Limited  activity within patient's tolerance;Monitored during session;Repositioned;Patient requesting pain meds-RN notified    Home Living                      Prior Function            PT Goals (current goals can now be found in the care plan section) Acute Rehab PT Goals PT Goal Formulation: With patient Time For Goal Achievement: 11/09/13 Potential to Achieve Goals: Good Progress towards PT goals: Progressing toward goals    Frequency  Min 3X/week    PT Plan Discharge plan needs to be updated;Frequency needs to be updated    Co-evaluation             End of Session   Activity Tolerance: Patient limited by fatigue;Patient limited by pain Patient left: in chair;with call bell/phone within reach;with family/visitor present     Time: 1120-1147 PT Time Calculation (min): 27 min  Charges:  $Gait Training: 8-22 mins $Therapeutic Activity: 8-22 mins                    G Codes:      [employer],  [phone]  Berton Mount 11/03/2013, 12:43 PM

## 2013-11-03 NOTE — Progress Notes (Signed)
     Subjective:  Sleeping soundly upon arrival.  Feeling well.  Patient reports pain as mild without complaint or concerns.    Objective:   VITALS:   Filed Vitals:   11/02/13 2035 11/02/13 2320 11/03/13 0400 11/03/13 0554  BP:    152/79  Pulse:    88  Temp:    98.7 F (37.1 C)  TempSrc:    Oral  Resp:  18 18   Height:      Weight:      SpO2: 96% 96% 98% 100%    ABD soft Sensation intact distally Dorsiflexion/Plantar flexion intact Incision: no drainage   Lab Results  Component Value Date   WBC 14.2* 11/03/2013   HGB 12.8* 11/03/2013   HCT 36.7* 11/03/2013   MCV 96.1 11/03/2013   PLT 216 11/03/2013     Assessment/Plan: 2 Days Post-Op   Principal Problem:   Left displaced femoral neck fracture Active Problems:   Diabetes mellitus   Hyperlipidemia   Seizure disorder   COPD (chronic obstructive pulmonary disease) with chronic bronchitis   Chronic diastolic congestive heart failure   Leukocytosis, unspecified   Acute blood loss anemia  Continue plan per medicine WBAT [employer] Dress[employer] PRN Advance diet Up with therapy   Haskel KhanDOUGLAS Teruo Stilley 11/03/2013, 7:20 AM   Margarita Ranaimothy Murphy MD [phone]

## 2013-11-03 NOTE — Progress Notes (Signed)
Rehab admissions - I met with pt and his wife in follow up to rehab MD consult and explained the possibility of inpatient rehab. Pt was sleepy throughout my interaction. When going over insurance information and baseline details, wife shared that pt has [employer] under her current policy. In Epic, pt was noted to have Medicare part A only. Wife was not aware that pt had active Medicare part A benefits. I copied pt's insurance card and then spoke directly with [employer] Rn representative about pt's case. Based on pt's current diagnosis of L hip hemiarthroplasty, [employer] would give authorization for skilled nursing only and not inpatient rehab.  I then explained this to pt/wife and they expressed understanding of insurance's recommendation for SNF. I then explained that social work would follow up for discharge planning. I updated Susan, case management and left message for Doris, social worker.  I will now sign off pt's case. Please call me with any questions. Thanks.   , PT Rehabilitation Admissions Coordinator [phone]  

## 2013-11-03 NOTE — Progress Notes (Addendum)
PROGRESS NOTE  JOURNEY RATTERMAN GBT:[id number] DOB: Nov 29, 1940 DOA: 10/31/2013 PCP: Kaleen Mask, MD  HPI/Recap of past 92 hours: 73 year old white male past medical history diastolic heart failure and COPD plus diabetes who sustained a mechanical fall and a secondary left displaced femoral neck fracture. Cleared for surgery and underwent left hip hemiarthroplasty 8/9 without complications.    The patient's insurance actually precludes him from inpatient rehabilitation. Skilled nursing facility search currently on the way. Patient's white blood cell count remains elevated.  Assessment/Plan: Principal Problem:   Left displaced femoral neck fracture:.  Stable. Still some residual pain. Expected blood loss anemia. Need skilled nursing.  Active Problems:   Diabetes mellitus: Not specified as uncontrolled. CBGs slightly elevated. Will increase sliding scale to resistant Holding oral hypoglycemics. A1c noted good control.    Hyperlipidemia: Stable. Continue statin.    Seizure disorder: Stable. Continued on home phenobarbital dose.  Obesity: Patient meets criteria with BMI greater than 30.  Leukocytosis: Patient white count remains elevated.  No fever. No dysuria. Lung exam clear. Could be stress margination with delay from hip fracture repair and secondary blood loss anemia. Will get chest x-ray and urinalysis if white blood cell count has not significantly improved tomorrow  Acute blood loss anemia: Secondary to surgery. Staying stable. No reason for transfusion    COPD (chronic obstructive pulmonary disease) with chronic bronchitis: Currently stable.    Chronic diastolic congestive heart failure noted incidentally on echocardiogram. BNP normal  Code Status: Full code  Family Communication: Wife at the bedside  Disposition Plan: Followup pending labs. Awaiting approval for inpatient rehabilitation  Consultants:  Orthopedics  Physical medicine and  rehabilitation  Procedures:  Status post arthroplasty done 8/9  Antibiotics:  None  HPI/Subjective: Patient doing okay. Some residual soreness  Objective: BP 105/61  Pulse 71  Temp(Src) 99.6 F (37.6 C) (Oral)  Resp 18  Ht 5\' 9"  (1.753 m)  Wt 98.884 kg (218 lb)  BMI 32.18 kg/m2  SpO2 95%  Intake/Output Summary (Last 24 hours) at 11/03/13 1530 Last data filed at 11/02/13 1700  Gross per 24 hour  Intake      0 ml  Output      0 ml  Net      0 ml   Filed Weights   10/31/13 2039  Weight: 98.884 kg (218 lb)    Exam: Relatively unchanged  General:  Alert and oriented x3, no acute distress  Cardiovascular: Regular rate and rhythm, S1-S2  Respiratory: Clear to auscultation bilaterally  Abdomen: Soft, nontender, nondistended, positive bowel sounds  Musculoskeletal: No clubbing or cyanosis, trace edema   Data Reviewed: Basic Metabolic Panel:  Recent Labs Lab 10/31/13 2156 11/02/13 1515 11/03/13 0437  NA 136* 132* 130*  K 4.6 4.3 4.3  CL 100 96 94*  CO2 21 21 22   GLUCOSE 118* 205* 157*  BUN 8 9 8   CREATININE 0.71 0.78 0.70  CALCIUM 9.0 9.0 9.1   CBC:  Recent Labs Lab 10/31/13 2156 11/02/13 1515 11/03/13 0437  WBC 14.5* 15.0* 14.2*  NEUTROABS 11.7*  --   --   HGB 14.1 12.9* 12.8*  HCT 40.3 38.2* 36.7*  MCV 97.1 96.7 96.1  PLT 259 224 216   Cardiac Enzymes:   No results found for this basename: CKTOTAL, CKMB, CKMBINDEX, TROPONINI,  in the last 168 hours BNP (last 3 results)  Recent Labs  11/02/13 1515  PROBNP 237.4*   CBG:  Recent Labs Lab 11/02/13 1109 11/02/13 1643 11/02/13 2149 11/03/13  [id number] 11/03/13 1111  GLUCAP 172* 156* 186* 146* 182*    No results found for this or any previous visit (from the past 240 hour(s)).   Studies: No results found.  Scheduled Meds: . aspirin  325 mg Oral Daily  . insulin aspart  0-15 Units Subcutaneous TID WC  . insulin aspart  0-5 Units Subcutaneous QHS  . lisinopril  20 mg Oral Daily   . mometasone-formoterol  2 puff Inhalation BID  . multivitamin with minerals  1 tablet Oral Daily  . nicotine  21 mg Transdermal Daily  . PHENobarbital  97.2 mg Oral QHS  . simvastatin  10 mg Oral q1800    Continuous Infusions:    Time spent: 15 minutes  Hollice EspyKRISHNAN,Tyronica Truxillo K  [facility] Pager [phone]. If 7PM-7AM, please contact night-coverage at [email or web address], password [email or web address] 11/03/2013, 3:30 PM  LOS: 3 days

## 2013-11-03 NOTE — Progress Notes (Signed)
I participated in the care of this patient and agree with the above history, physical and evaluation. I performed a review of the history and a physical exam as detailed   Teyon Odette Daniel Bode Pieper MD  

## 2013-11-04 ENCOUNTER — Other Ambulatory Visit: Payer: Self-pay | Admitting: *Deleted

## 2013-11-04 LAB — CBC
HEMATOCRIT: 35.2 % — AB (ref 39.0–52.0)
Hemoglobin: 12.2 g/dL — ABNORMAL LOW (ref 13.0–17.0)
MCH: 33.4 pg (ref 26.0–34.0)
MCHC: 34.7 g/dL (ref 30.0–36.0)
MCV: 96.4 fL (ref 78.0–100.0)
PLATELETS: 230 10*3/uL (ref 150–400)
RBC: 3.65 MIL/uL — ABNORMAL LOW (ref 4.22–5.81)
RDW: 12.5 % (ref 11.5–15.5)
WBC: 12.3 10*3/uL — AB (ref 4.0–10.5)

## 2013-11-04 LAB — GLUCOSE, CAPILLARY
GLUCOSE-CAPILLARY: 142 mg/dL — AB (ref 70–99)
Glucose-Capillary: 170 mg/dL — ABNORMAL HIGH (ref 70–99)

## 2013-11-04 MED ORDER — COLCHICINE 0.6 MG PO TABS
0.6000 mg | ORAL_TABLET | Freq: Every day | ORAL | Status: DC
  Administered 2013-11-04: 0.6 mg via ORAL
  Filled 2013-11-04 (×2): qty 1

## 2013-11-04 MED ORDER — COLCHICINE 0.6 MG PO TABS: 0.6000 mg | ORAL_TABLET | Freq: Every day | ORAL | Status: DC

## 2013-11-04 MED ORDER — ALLOPURINOL 100 MG PO TABS: 100.0000 mg | ORAL_TABLET | Freq: Every day | ORAL | Status: DC

## 2013-11-04 MED ORDER — PHENOBARBITAL 97.2 MG PO TABS: ORAL_TABLET | ORAL | Status: DC

## 2013-11-04 NOTE — Progress Notes (Signed)
I participated in the care of this patient and agree with the above history, physical and evaluation. I performed a review of the history and a physical exam as detailed   Montee Tallman Daniel Babbette Dalesandro MD  

## 2013-11-04 NOTE — Telephone Encounter (Signed)
[facility] 

## 2013-11-04 NOTE — Discharge Summary (Signed)
Physician Discharge Summary  Keith Wise:[id number] DOB: [birth year] DOA: 10/31/2013  PCP: Kaleen Mask, MD  Admit date: 10/31/2013 Discharge date: 11/04/2013  Time spent: 35 minutes  Recommendations for Outpatient Follow-up:  1. Follow up with PCP in 2 weeks 2. Follow up with Dr. Eulah Pont in 2 weeks   Discharge Diagnoses:  Principal Problem:   Left displaced femoral neck fracture Active Problems:   Diabetes mellitus   Hyperlipidemia   Seizure disorder   COPD (chronic obstructive pulmonary disease) with chronic bronchitis   Chronic diastolic congestive heart failure   Leukocytosis, unspecified   Acute blood loss anemia   Obesity (BMI 30-39.9)   Discharge Condition: stable  Diet recommendation: heart healthy  Filed Weights   10/31/13 2039 11/03/13 2300  Weight: 98.884 kg (218 lb) 96.571 kg (212 lb 14.4 oz)   History of present illness:  Keith Wise is a 73 y.o. male who was playing football earlier this afternoon when he fell and landed on concrete. He fell on his Left side and had immediate left hip pain, severe, 10/10, worse with movement, better with rest, associated inability to ambulate. Left hip fracture found on ED work up.  Hospital Course:  Left displaced femoral neck fracture - Stable, s/p arthroplasty per orthopedic surgery on 8/9. Still some residual pain. Expected blood loss anemia without needs for transfusion. Needs skilled nursing on discharge.  Diabetes mellitus: resume prior medications on discharge, add sliding scale in SNF if CBGs elevated. A1c noted good control.  Hyperlipidemia: Stable. Continue statin.  Seizure disorder: Stable. Continued on home phenobarbital dose.  Obesity: Patient meets criteria with BMI greater than 30.  Leukocytosis: Patient white count elevated on admission without clinical suspicion for infectious source without fevers, dysuria, no respiratory symptoms, likely due to stress margination from hip fracture, WBC  improved on discharge.  Acute blood loss anemia: Secondary to surgery. Staying stable. No reason for transfusion  COPD (chronic obstructive pulmonary disease) with chronic bronchitis: Currently stable.  Chronic diastolic congestive heart failure noted incidentally on echocardiogram. BNP normal  Procedures:  S/p arthroplasty 8/9   Consultations:  Orthopedic surgery   Discharge Exam: Filed Vitals:   11/03/13 2300 11/04/13 0000 11/04/13 0400 11/04/13 0500  BP:    153/94  Pulse:    103  Temp:    98.8 F (37.1 C)  TempSrc:    Oral  Resp:  20 20 20   Height: 5\' 9"  (1.753 m)     Weight: 96.571 kg (212 lb 14.4 oz)     SpO2:  99% 98% 96%   General: NAD Cardiovascular: RRR Respiratory: decreased breath sounds throughout, no wheezing  Discharge Instructions      Discharge Instructions   Weight bearing as tolerated    Complete by:  As directed             Medication List    STOP taking these medications       aspirin 325 MG tablet  Replaced by:  aspirin EC 325 MG tablet      TAKE these medications       allopurinol 100 MG tablet  Commonly known as:  ZYLOPRIM  Take 1 tablet (100 mg total) by mouth daily.     aspirin EC 325 MG tablet  Take 1 tablet (325 mg total) by mouth daily.     colchicine 0.6 MG tablet  Take 1 tablet (0.6 mg total) by mouth daily. For 5 days     docusate sodium 100 MG capsule  Commonly known as:  COLACE  Take 1 capsule (100 mg total) by mouth 2 (two) times daily. Continue this while taking narcotics to help with bowel movements     glimepiride 2 MG tablet  Commonly known as:  AMARYL  Take 2 mg by mouth daily with breakfast.     HYDROcodone-acetaminophen 5-325 MG per tablet  Commonly known as:  NORCO  Take 1-2 tablets by mouth every 4 (four) hours as needed for moderate pain.     ibuprofen 200 MG tablet  Commonly known as:  ADVIL,MOTRIN  Take 400 mg by mouth every morning.     lisinopril 20 MG tablet  Commonly known as:   PRINIVIL,ZESTRIL  Take 20 mg by mouth daily.     metFORMIN 1000 MG tablet  Commonly known as:  GLUCOPHAGE  Take 1,000 mg by mouth at bedtime.     methocarbamol 500 MG tablet  Commonly known as:  ROBAXIN  Take 1 tablet (500 mg total) by mouth every 6 (six) hours as needed.     ondansetron 4 MG tablet  Commonly known as:  ZOFRAN  Take 1 tablet (4 mg total) by mouth every 8 (eight) hours as needed for nausea.     PHENobarbital 97.2 MG tablet  Commonly known as:  LUMINAL  Take 97.2 mg by mouth at bedtime.     pravastatin 40 MG tablet  Commonly known as:  PRAVACHOL  Take 40 mg by mouth daily.     sildenafil 100 MG tablet  Commonly known as:  VIAGRA  Take 100 mg by mouth daily as needed for erectile dysfunction.       Follow-up Information   Follow up with MURPHY, TIMOTHY, D, MD In 2 weeks.   Specialty:  Orthopedic Surgery   Contact information:   [address] [place] [address] [phone]       Follow up with Kaleen Mask, MD. Schedule an appointment as soon as possible for a visit in 3 weeks.   Specialty:  Family Medicine   Contact information:   [address] [place] [phone]       The results of significant diagnostics from this hospitalization (including imaging, microbiology, ancillary and laboratory) are listed below for reference.    Significant Diagnostic Studies: Dg Chest 1 View  10/31/2013   CLINICAL DATA:  Status post fall.  Concern for chest injury.  EXAM: CHEST - 1 VIEW  COMPARISON:  Chest radiograph performed 04/15/2011  FINDINGS: The lungs are well-aerated. Pulmonary vascularity is at the upper limits of normal. There is no evidence of focal opacification, pleural effusion or pneumothorax. Minimal left-sided peripheral density is thought to reflect overlying soft tissues.  The cardiomediastinal silhouette is within normal limits. No acute osseous abnormalities are seen. Cervical spinal fusion hardware  is noted. There is slight chronic irregularity of the distal left clavicle.  IMPRESSION: No acute cardiopulmonary process seen. No displaced rib fractures identified.   Electronically Signed   By: Roanna Raider M.D.   On: 10/31/2013 22:04   Dg Elbow Complete Left  11/01/2013   CLINICAL DATA:  Larey Seat, left elbow pain  EXAM: LEFT ELBOW - COMPLETE 3+ VIEW  COMPARISON:  None.  FINDINGS: There is no evidence of fracture, dislocation, or joint effusion. There is no evidence of arthropathy or other focal bone abnormality. Soft tissues are unremarkable.  IMPRESSION: Negative.   Electronically Signed   By: Esperanza Heir M.D.   On: 11/01/2013 00:27   Dg  Hip Complete Left  11/01/2013   CLINICAL DATA:  73 year old male status post left hip hemiarthroplasty  EXAM: LEFT HIP - COMPLETE 2+ VIEW  COMPARISON:  None.  FINDINGS: Left hip hemiarthroplasty identified.  No fracture, subluxation or dislocation noted.  No complicating features identified.  IMPRESSION: Left hip hemiarthroplasty without complicating features.   Electronically Signed   By: Laveda AbbeJeff  Hu M.D.   On: 11/01/2013 14:02   Dg Hip Complete Left  10/31/2013   CLINICAL DATA:  Status post fall.  Left hip pain.  EXAM: LEFT HIP - COMPLETE 2+ VIEW  COMPARISON:  None.  FINDINGS: There is a transcervical fracture through the left femoral neck, with mild superior displacement of the distal femur, and mild rotation of the femoral head. The left femoral head remains seated at the acetabulum.  The right hip joint is grossly unremarkable in appearance. No significant degenerative change is appreciated. The sacroiliac joints are unremarkable in appearance.  The visualized bowel gas pattern is grossly unremarkable in appearance.  IMPRESSION: Transcervical fracture through the left femoral neck, with mild superior displacement of the distal femur, and mild rotation of the femoral head.   Electronically Signed   By: Roanna RaiderJeffery  Chang M.D.   On: 10/31/2013 21:59   Dg Hand Complete  Left  11/01/2013   CLINICAL DATA:  Fall, left hand pain  EXAM: LEFT HAND - COMPLETE 3+ VIEW  COMPARISON:  None.  FINDINGS: Diffuse osteopenia.  No fracture or dislocation.  IMPRESSION: No acute findings   Electronically Signed   By: Esperanza Heiraymond  Rubner M.D.   On: 11/01/2013 00:28   Labs: Basic Metabolic Panel:  Recent Labs Lab 10/31/13 2156 11/02/13 1515 11/03/13 0437  NA 136* 132* 130*  K 4.6 4.3 4.3  CL 100 96 94*  CO2 21 21 22   GLUCOSE 118* 205* 157*  BUN 8 9 8   CREATININE 0.71 0.78 0.70  CALCIUM 9.0 9.0 9.1   CBC:  Recent Labs Lab 10/31/13 2156 11/02/13 1515 11/03/13 0437 11/04/13 0535  WBC 14.5* 15.0* 14.2* 12.3*  NEUTROABS 11.7*  --   --   --   HGB 14.1 12.9* 12.8* 12.2*  HCT 40.3 38.2* 36.7* 35.2*  MCV 97.1 96.7 96.1 96.4  PLT 259 224 216 230   BNP: BNP (last 3 results)  Recent Labs  11/02/13 1515  PROBNP 237.4*   CBG:  Recent Labs Lab 11/03/13 1111 11/03/13 1729 11/03/13 2212 11/04/13 0633 11/04/13 1124  GLUCAP 182* 245* 176* 142* 170*   Signed:  Mckenzie Bove  [facility] 11/04/2013, 1:03 PM

## 2013-11-04 NOTE — Progress Notes (Signed)
Physical Therapy Treatment Patient Details Name: Keith Wise MRN: [id number] DOB: [birth year] Today's Date: 11/04/2013    History of Present Illness 73 y.o. male s/p left hip hemiarthroplasty. Hx of DM, and HTN.    PT Comments    Patient is progressing slowly towards physical therapy goals, limited by decreased strength and severe joint pain (elbows/knees) today. Unable to tolerate therapeutic exercises. Mod assist for all mobility. Patient will continue to benefit from skilled physical therapy services to further improve independence with functional mobility.    Follow Up Recommendations  SNF;Supervision for mobility/OOB     Equipment Recommendations  Rolling walker with 5" wheels;3in1 (PT)    Recommendations for Other Services       Precautions / Restrictions Precautions Precautions: Posterior Hip Precaution Booklet Issued: Yes (comment) Precaution Comments: Reviewed posterior hip precautions. Recalls 0/0 precautions Restrictions Weight Bearing Restrictions: Yes LLE Weight Bearing: Weight bearing as tolerated    Mobility  Bed Mobility Overal bed mobility: Needs Assistance Bed Mobility: Supine to Sit     Supine to sit: Mod assist;HOB elevated     General bed mobility comments: Mod assist for LLE support and trunk control. Slowy improving ability to use UEs and trunk to sit at EOB but requires considerable amount of time due to pain.  Transfers Overall transfer level: Needs assistance Equipment used: Rolling walker (2 wheeled) Transfers: Sit to/from [employer] CorporationStand;Stand Pivot Transfers Sit to Stand: Mod assist;From elevated surface Stand pivot transfers: Mod assist;+2 safety/equipment       General transfer comment: Several attempts to stand from elevated bed surface. Increased difficulty compared to yesterday. Right UE and LE with decreased strength and rapid fatigue. Reports bil knee and elbow pain. Mod assist for boost and stand pivot transfer with max cues for  sequencing and upright posture.  Ambulation/Gait                 Stairs            Wheelchair Mobility    Modified Rankin (Stroke Patients Only)       Balance                                    Cognition Arousal/Alertness: Awake/alert Behavior During Therapy: WFL for tasks assessed/performed Overall Cognitive Status: Impaired/Different from baseline Area of Impairment: Following commands;Attention;Memory   Current Attention Level: Selective Memory: Decreased recall of precautions;Decreased short-term memory Following Commands: Follows one step commands inconsistently       General Comments: Pt with increased difficulty following commands compared to previous therapy session. Altered attention during therapy. Very slow to respond to questions    Exercises Total Joint Exercises Ankle Circles/Pumps: AROM;Both;10 reps;Seated    General Comments General comments (skin integrity, edema, etc.): Pt worried his gout is causing severe pain in elbows and knees bilaterally. Limiting ability to perform all mobility tasks.      Pertinent Vitals/Pain Pain Assessment: 0-10 Pain Score: 10-Worst pain ever Pain Location: left hip Pain Intervention(s): Limited activity within patient's tolerance;Monitored during session;Repositioned;Patient requesting pain meds-RN notified    Home Living                      Prior Function            PT Goals (current goals can now be found in the care plan section) Acute Rehab PT Goals PT Goal Formulation: With patient Time For Goal Achievement: 11/09/13  Potential to Achieve Goals: Good Progress towards PT goals: Progressing toward goals    Frequency  Min 3X/week    PT Plan Current plan remains appropriate    Co-evaluation             End of Session   Activity Tolerance: Patient limited by pain Patient left: in chair;with call bell/phone within reach;with family/visitor present     Time:  1610-9604 PT Time Calculation (min): 33 min  Charges:  $Therapeutic Activity: 23-37 mins                    G Codes:      Keith Wise, Keith Wise [phone]  Keith Wise 11/04/2013, 10:12 AM

## 2013-11-04 NOTE — Clinical Social Work Placement (Addendum)
Clinical Social Work Department CLINICAL SOCIAL WORK PLACEMENT NOTE 11/04/2013  Patient:  Keith Wise,Keith Wise  Account Number:  [id number] Admit date:  10/31/2013  Clinical Social Worker:  Read DriversEGINA Jotham Ahn, LCSWA  Date/time:  11/04/2013 02:01 PM  Clinical Social Work is seeking post-discharge placement for this patient at the following level of care:   SKILLED NURSING   (*CSW will update this form in Epic as items are completed)   11/04/2013  [facility]/family provided with [facility] of Clinical [facility] Work's list of facilities offering this level of care within the geographic area requested by the patient (or if unable, by the patient's family).  11/04/2013  Patient/family informed of their freedom to choose among providers that offer the needed level of care, that participate in Medicare, Medicaid or managed care program needed by the patient, have an available bed and are willing to accept the patient.  11/04/2013  Patient/family informed of [facility]' ownership interest in [facility], as well as of the fact that they are under no obligation to receive care at this facility.  PASARR submitted to EDS on 11/04/2013 PASARR number received on 11/04/2013  FL2 transmitted to all facilities in geographic area requested by pt/family on  11/04/2013 FL2 transmitted to all facilities within larger geographic area on   Patient informed that his/her managed care company has contracts with or will negotiate with  certain facilities, including the following:     Patient/family informed of bed offers received:  11/04/2013 [facility] Physician recommends and patient chooses bed at    [facility]  11/04/2013 Patient to be transferred to facility by [facility] Patient and family notified of transfer on 11/04/2013 Name of family member notified:  Glena Norfolkolly, wife at bedside  The following physician request were entered in  Epic:   Additional Comments:  Vickii PennaGina Avea Mcgowen, LCSWA [phone]  Clinical Social Work

## 2013-11-04 NOTE — Progress Notes (Signed)
     Subjective:  Sleeping upon arrival.  Patient feeling good today.  Reports pain as mild to moderate.  No concerns or questions.  Objective:   VITALS:   Filed Vitals:   11/03/13 2300 11/04/13 0000 11/04/13 0400 11/04/13 0500  BP:    153/94  Pulse:    103  Temp:    98.8 F (37.1 C)  TempSrc:    Oral  Resp:  20 20 20   Height: 5\' 9"  (1.753 m)     Weight: 96.571 kg (212 lb 14.4 oz)     SpO2:  99% 98% 96%    ABD soft Neurovascular intact Sensation intact distally Dorsiflexion/Plantar flexion intact Incision: scant drainage   Lab Results  Component Value Date   WBC 12.3* 11/04/2013   HGB 12.2* 11/04/2013   HCT 35.2* 11/04/2013   MCV 96.4 11/04/2013   PLT 230 11/04/2013     Assessment/Plan: 3 Days Post-Op   Principal Problem:   Left displaced femoral neck fracture Active Problems:   Diabetes mellitus   Hyperlipidemia   Seizure disorder   COPD (chronic obstructive pulmonary disease) with chronic bronchitis   Chronic diastolic congestive heart failure   Leukocytosis, unspecified   Acute blood loss anemia   Obesity (BMI 30-39.9)  Continue plan per medicine  WBAT  [employer] PRN  Advance diet Up with therapy   Haskel KhanDOUGLAS Zoella Roberti 11/04/2013, 7:52 AM   Margarita Ranaimothy Murphy MD [phone]

## 2013-11-04 NOTE — Discharge Planning (Signed)
[facility]: [facility] Transportation: [facility] - CSW called for transport. RN to call report to: [place] [phone] Family notified: Glena Norfolkolly, wife, at bedside.  Vickii PennaGina Roman Dubuc, LCSWA [phone]  Clinical Social Work

## 2013-11-04 NOTE — Clinical Social Work Psychosocial (Addendum)
Clinical Social Work Department BRIEF PSYCHOSOCIAL ASSESSMENT 11/04/2013  Patient:  Keith Wise, Keith Wise     Account Number:  [id number]     Admit date:  10/31/2013  Clinical Social Worker:  Wylene Men  Date/Time:  11/04/2013 01:59 PM  Referred by:  Physician  Date Referred:  11/04/2013 Referred for  SNF Placement   Other Referral:   none   Interview type:  Other - See comment Other interview type:   wife, Keith Wise and pt    PSYCHOSOCIAL DATA Living Status:  WIFE Admitted from facility:   Level of care:   Primary support name:  Keith Wise, wife Primary support relationship to patient:  SPOUSE Degree of support available:   adequate    CURRENT CONCERNS Current Concerns  Post-Acute Placement   Other Concerns:   none    SOCIAL WORK ASSESSMENT / PLAN CSW met with pt at bedside.  Pt was sitting upright in the bedside chair eating lunch.  Wife was present.  Pt was alert and oriented during the course of the assessment. CSW introduced self and CSW role to pt and pt wife.  PT recommends SNF/STR at time of dc.  Pt and wife is agreeable to this recommendation and feel it is best for pt recovery. Pt is having difficulty controlling pain and voiced frustration in dealing with limited mobility/pain.  Pt and wife are both  optmistic regarding pt recovery process.  Pt and pt wife choose [facility], SNF.  SNF has offered bed to pt upon dc.  MD has completed the dc summary.  DC summary has been sent to [facility] and wife, at bedside, will complete admission's paperwork.  MD is agreeable to dc today.  Pt requests to be transported via [facility] for safety reasons.   Assessment/plan status:   Other assessment/ plan:   FL2  PASARR  face-to-face facility interview   Information/referral to community resources:   SNF    PATIENT'S/FAMILY'S RESPONSE TO PLAN OF CARE: pt and wife are agreeable to SNF disposition.  Both were appreciative of CSW support and assistance.

## 2013-11-04 NOTE — Clinical Social Work Note (Signed)
CSW met with pt at wife at bedside (full assessment to follow).  PT recommends SNF/STR and pt/wife chooses: [facility].  [place] is able to offer bed.  Hoyle Sauer to complete paperwork at bedside.  Pt, facility and MD aware.  FL2 is on the chart and will need to be signed.  Nonnie Done, [place] [phone]  Clinical Social Work

## 2013-11-05 ENCOUNTER — Non-Acute Institutional Stay (SKILLED_NURSING_FACILITY): Payer: 59 | Admitting: Adult Health

## 2013-11-05 ENCOUNTER — Encounter: Payer: Self-pay | Admitting: Adult Health

## 2013-11-05 DIAGNOSIS — S72009A Fracture of unspecified part of neck of unspecified femur, initial encounter for closed fracture: Secondary | ICD-10-CM

## 2013-11-05 DIAGNOSIS — I509 Heart failure, unspecified: Secondary | ICD-10-CM

## 2013-11-05 DIAGNOSIS — E785 Hyperlipidemia, unspecified: Secondary | ICD-10-CM

## 2013-11-05 DIAGNOSIS — I5032 Chronic diastolic (congestive) heart failure: Secondary | ICD-10-CM

## 2013-11-05 DIAGNOSIS — S72002A Fracture of unspecified part of neck of left femur, initial encounter for closed fracture: Secondary | ICD-10-CM

## 2013-11-05 DIAGNOSIS — I10 Essential (primary) hypertension: Secondary | ICD-10-CM

## 2013-11-05 DIAGNOSIS — D62 Acute posthemorrhagic anemia: Secondary | ICD-10-CM

## 2013-11-05 DIAGNOSIS — IMO0001 Reserved for inherently not codable concepts without codable children: Secondary | ICD-10-CM

## 2013-11-05 DIAGNOSIS — E1165 Type 2 diabetes mellitus with hyperglycemia: Secondary | ICD-10-CM

## 2013-11-05 DIAGNOSIS — Z720 Tobacco use: Secondary | ICD-10-CM

## 2013-11-05 DIAGNOSIS — F17200 Nicotine dependence, unspecified, uncomplicated: Secondary | ICD-10-CM

## 2013-11-05 DIAGNOSIS — G40909 Epilepsy, unspecified, not intractable, without status epilepticus: Secondary | ICD-10-CM

## 2013-11-05 DIAGNOSIS — M109 Gout, unspecified: Secondary | ICD-10-CM

## 2013-11-05 NOTE — Progress Notes (Signed)
Patient ID: Keith Wise, male   DOB: 11-20-1940, 73 y.o.   MRN: [id number]               PROGRESS NOTE  DATE: 11/05/2013  FACILITY: Nursing Home Location: [facility]  LEVEL OF CARE: SNF (31)  Acute Visit  CHIEF COMPLAINT:  Follow-up Hospitalization  HISTORY OF PRESENT ILLNESS: This is a 73 year old male who has been [facility] 11/04/13 [facility] with Left displaced femoral neck fracture S/P arthroplasty. He has been admitted  For a short-term rehabilitation.  REASSESSMENT OF ONGOING PROBLEM(S):  DM:pt's DM remains stable.  Pt denies polyuria, polydipsia, polyphagia, changes in vision or hypoglycemic episodes.  No complications noted from the medication presently being used.  8/15 hemoglobin A1c is: 6.1  CHF:The patient does not relate significant weight changes, denies sob, DOE, orthopnea, PNDs, pedal edema, palpitations or chest pain.  CHF remains stable.  No complications form the medications being used.  ANEMIA: The anemia has been stable. The patient denies fatigue, melena or hematochezia. No complications from the medications currently being used. 8/15 hgb 12.2  PAST MEDICAL HISTORY : Reviewed.  No changes/see problem list  CURRENT MEDICATIONS: Reviewed per MAR/see medication list  REVIEW OF SYSTEMS:  GENERAL: no change in appetite, no fatigue, no weight changes, no fever, chills or weakness RESPIRATORY: no cough, SOB, DOE, wheezing, hemoptysis CARDIAC: no chest pain, edema or palpitations GI: no abdominal pain, diarrhea, constipation, heart burn, nausea or vomiting  PHYSICAL EXAMINATION  GENERAL: no acute distress, obese EYES: conjunctivae normal, sclerae normal, normal eye lids NECK: supple, trachea midline, no neck masses, no thyroid tenderness, no thyromegaly LYMPHATICS: no LAN in the neck, no supraclavicular LAN RESPIRATORY: breathing is even & unlabored, BS CTAB CARDIAC: RRR, no murmur,no extra heart sounds, no  edema GI: abdomen soft, normal BS, no masses, no tenderness, no hepatomegaly, no splenomegaly EXTREMITIES:  Able to move all 4 extremities with limited ROM on LLE due to surgery PSYCHIATRIC: the patient is alert & oriented to person, affect & behavior appropriate  LABS/RADIOLOGY: Labs reviewed: Basic Metabolic Panel:  Recent Labs  56/21/3006/11/08 2156 11/02/13 1515 11/03/13 0437  NA 136* 132* 130*  K 4.6 4.3 4.3  CL 100 96 94*  CO2 21 21 22   GLUCOSE 118* 205* 157*  BUN 8 9 8   CREATININE 0.71 0.78 0.70  CALCIUM 9.0 9.0 9.1   CBC:  Recent Labs  10/31/13 2156 11/02/13 1515 11/03/13 0437 11/04/13 0535  WBC 14.5* 15.0* 14.2* 12.3*  NEUTROABS 11.7*  --   --   --   HGB 14.1 12.9* 12.8* 12.2*  HCT 40.3 38.2* 36.7* 35.2*  MCV 97.1 96.7 96.1 96.4  PLT 259 224 216 230   CBG:  Recent Labs  11/03/13 2212 11/04/13 0633 11/04/13 1124  GLUCAP 176* 142* 170*    EXAM: LEFT HIP - COMPLETE 2+ VIEW   COMPARISON:  None.   FINDINGS: There is a transcervical fracture through the left femoral neck, with mild superior displacement of the distal femur, and mild rotation of the femoral head. The left femoral head remains seated at the acetabulum.   The right hip joint is grossly unremarkable in appearance. No significant degenerative change is appreciated. The sacroiliac joints are unremarkable in appearance.   The visualized bowel gas pattern is grossly unremarkable in appearance.   IMPRESSION: Transcervical fracture through the left femoral neck, with mild superior displacement of the distal femur, and mild rotation of the  femoral head. EXAM: CHEST - 1 VIEW   COMPARISON:  Chest radiograph performed 04/15/2011   FINDINGS: The lungs are well-aerated. Pulmonary vascularity is at the upper limits of normal. There is no evidence of focal opacification, pleural effusion or pneumothorax. Minimal left-sided peripheral density is thought to reflect overlying soft tissues.   The  cardiomediastinal silhouette is within normal limits. No acute osseous abnormalities are seen. Cervical spinal fusion hardware is noted. There is slight chronic irregularity of the distal left clavicle.   IMPRESSION: No acute cardiopulmonary process seen. No displaced rib fractures identified. EXAM: LEFT ELBOW - COMPLETE 3+ VIEW   COMPARISON:  None.   FINDINGS: There is no evidence of fracture, dislocation, or joint effusion. There is no evidence of arthropathy or other focal bone abnormality. Soft tissues are unremarkable.   IMPRESSION: Negative. EXAM: LEFT HAND - COMPLETE 3+ VIEW   COMPARISON:  None.   FINDINGS: Diffuse osteopenia.  No fracture or dislocation.   IMPRESSION: No acute findings EXAM: LEFT HIP - COMPLETE 2+ VIEW   COMPARISON:  None.   FINDINGS: Left hip hemiarthroplasty identified.   No fracture, subluxation or dislocation noted.   No complicating features identified.   IMPRESSION: Left hip hemiarthroplasty without complicating features.      ASSESSMENT/PLAN:  Left displaced femoral neck fracture S/P Arthroplasty - for rehabilitation Diabetes Mellitus, type 2 - well-controlled; continue Glucophage and Amaryl Hyperlipidemia - continue Lipitor Seizure disorder - continue Phenobarbital Chronic diastolic CHF - stable; check weight 3X/week Anemia - acute blood loss - check CBC Gout - continue Colchicine and Allopurinol Hypertension - well-controlled; continue Lisinopril Tobacco abuse - start Nicotine 21mg /day 1 patch transdermally q d x 6 weeks   CPT CODE: 16109  Ella Bodo - NP [facility] [phone]

## 2013-11-10 ENCOUNTER — Non-Acute Institutional Stay (SKILLED_NURSING_FACILITY): Payer: 59 | Admitting: Internal Medicine

## 2013-11-10 DIAGNOSIS — J449 Chronic obstructive pulmonary disease, unspecified: Secondary | ICD-10-CM

## 2013-11-10 DIAGNOSIS — E119 Type 2 diabetes mellitus without complications: Secondary | ICD-10-CM

## 2013-11-10 DIAGNOSIS — S72002A Fracture of unspecified part of neck of left femur, initial encounter for closed fracture: Secondary | ICD-10-CM

## 2013-11-10 DIAGNOSIS — S72009A Fracture of unspecified part of neck of unspecified femur, initial encounter for closed fracture: Secondary | ICD-10-CM

## 2013-11-10 DIAGNOSIS — G40909 Epilepsy, unspecified, not intractable, without status epilepticus: Secondary | ICD-10-CM

## 2013-11-12 NOTE — Progress Notes (Signed)
HISTORY & PHYSICAL  DATE: 11/10/2013   FACILITY: [facility]  LEVEL OF CARE: SNF (31)  ALLERGIES:  No Known Allergies  CHIEF COMPLAINT:  Manage left hip fracture, seizure disorder and COPD  HISTORY OF PRESENT ILLNESS: Patient is a 73 year old Caucasian male.  HIP FRACTURE: The patient had a mechanical fall and sustained a femur fracture.  Patient subsequently underwent arthroplasty and tolerated the procedure well. Patient is admitted to this facility for short-term rehabilitation. Patient denies hip pain currently. No complications reported from the pain medications currently being used.  COPD: the COPD remains stable.  Pt denies sob, cough, wheezing or declining exercise tolerance.  No complications from the medications presently being used.  SEIZURE DISORDER: The patient's seizure disorder remains stable. No complications reported from the medications presently being used. Staff do not report any recent seizure activity.  PAST MEDICAL HISTORY :  Past Medical History  Diagnosis Date  . Diabetes mellitus   . Hypertension   . Hypercholesteremia   . COPD (chronic obstructive pulmonary disease)   . Shortness of breath   . Seizures     last seizure in the 80's     PAST SURGICAL HISTORY: Past Surgical History  Procedure Laterality Date  . Orthopedic surgery      lumbar, cervical  . Hemiarthroplasty hip Left 11/01/2013    dr Eulah Pont  . Cervical disc surgery    . Back surgery    . Hip arthroplasty Left 11/01/2013    Procedure: Left hip  Hemiarthroplasty;  Surgeon: Sheral Apley, MD;  Location: [facility];  Service: Orthopedics;  Laterality: Left;    SOCIAL HISTORY:  reports that he has been smoking Cigarettes.  He has a 110 pack-year smoking history. He has never used smokeless tobacco. He reports that he does not drink alcohol or use illicit drugs.  FAMILY HISTORY:  Family History  Problem Relation Age of Onset  . Diabetes type II       CURRENT MEDICATIONS: Reviewed per MAR/see medication list  REVIEW OF SYSTEMS:  See HPI otherwise 14 point ROS is negative.  PHYSICAL EXAMINATION  VS:  See VS section  GENERAL: no acute distress, normal body habitus EYES: conjunctivae normal, sclerae normal, normal eye lids MOUTH/THROAT: lips without lesions,no lesions in the mouth,tongue is without lesions,uvula elevates in midline NECK: supple, trachea midline, no neck masses, no thyroid tenderness, no thyromegaly LYMPHATICS: no LAN in the neck, no supraclavicular LAN RESPIRATORY: breathing is even & unlabored, BS CTAB CARDIAC: RRR, no murmur,no extra heart sounds, no edema GI:  ABDOMEN: abdomen soft, normal BS, no masses, no tenderness  LIVER/SPLEEN: no hepatomegaly, no splenomegaly MUSCULOSKELETAL: HEAD: normal to inspection  EXTREMITIES: LEFT UPPER EXTREMITY: full range of motion, normal strength & tone RIGHT UPPER EXTREMITY:  full range of motion, normal strength & tone LEFT LOWER EXTREMITY:  range of motion not tested due to surgery, normal strength & tone RIGHT LOWER EXTREMITY: Moderate range of motion, normal strength & tone PSYCHIATRIC: the patient is alert & oriented to person, affect & behavior appropriate  LABS/RADIOLOGY:  Labs reviewed: Basic Metabolic Panel:  Recent Labs  96/04/54 2156 11/02/13 1515 11/03/13 0437  NA 136* 132* 130*  K 4.6 4.3 4.3  CL 100 96 94*  CO2 21 21 22   GLUCOSE 118* 205* 157*  BUN 8 9 8   CREATININE 0.71 0.78 0.70  CALCIUM 9.0 9.0 9.1   CBC:  Recent Labs  10/31/13 2156 11/02/13 1515 11/03/13  [id number] 11/04/13 0535  WBC 14.5* 15.0* 14.2* 12.3*  NEUTROABS 11.7*  --   --   --   HGB 14.1 12.9* 12.8* 12.2*  HCT 40.3 38.2* 36.7* 35.2*  MCV 97.1 96.7 96.1 96.4  PLT 259 224 216 230   CBG:  Recent Labs  11/03/13 2212 11/04/13 0633 11/04/13 1124  GLUCAP 176* 142* 170*    Transthoracic Echocardiography  Patient:    Thomasene LotGlass, Ranveer M MR #:       [id number] Study Date:  04/17/2011 Gender:     M Age:        [age] Height:     175.3cm Weight:     100.2kg BSA:        2.4065m^2 Pt. Status: Room:       1438    PERFORMING   [facility]  SONOGRAPHER  Kwong(Billy) Dory Peruhung, RDCS  ATTENDING    Thedore MinsSingh, Prashant  Hoover BrownsDERING     Singh, Prashant  REFERRING    FranklinSingh, Prashant  ADMITTING    Crosley, Debby cc:  ------------------------------------------------------------ LV EF: 65% -   70%  ------------------------------------------------------------ Indications:      Dyspnea 786.09.  ------------------------------------------------------------ History:   PMH:   Chronic obstructive pulmonary disease. Risk factors:  SIRS. Current tobacco use. Hypertension. Diabetes mellitus. Dyslipidemia.  ------------------------------------------------------------ Study Conclusions  - Left ventricle: There was mild concentric hypertrophy.   Systolic function was vigorous. The estimated ejection   fraction was in the range of 65% to 70%. Wall motion was   normal; there were no regional wall motion abnormalities.   Doppler parameters are consistent with abnormal left   ventricular relaxation (grade 1 diastolic dysfunction). - Right ventricle: The cavity size was mildly dilated. Transthoracic echocardiography.  M-mode, complete 2D, spectral Doppler, and color Doppler.  Height:  Height: 175.3cm. Height: 69in.  Weight:  Weight: 100.2kg. Weight: 220.5lb.  Body mass index:  BMI: 32.6kg/m^2.  Body surface area:    BSA: 2.8165m^2.  Blood pressure:     103/56.  Patient status:  Inpatient.  Location:  Bedside.  ------------------------------------------------------------  ------------------------------------------------------------ Left ventricle:  There was mild concentric hypertrophy. Systolic function was vigorous. The estimated ejection fraction was in the range of 65% to 70%. Wall motion was normal; there were no regional wall motion abnormalities. Doppler parameters are consistent with  abnormal left ventricular relaxation (grade 1 diastolic dysfunction). There was no evidence of elevated ventricular filling pressure by Doppler parameters.  ------------------------------------------------------------ Aortic valve:   Trileaflet; mildly thickened leaflets. Doppler:   There was no stenosis.    No significant regurgitation.  ------------------------------------------------------------ Aorta:  The aorta was poorly visualized.  ------------------------------------------------------------ Mitral valve:   Structurally normal valve.   Leaflet separation was normal.  Doppler:  Transvalvular velocity was within the normal range. There was no evidence for stenosis.  No regurgitation.  ------------------------------------------------------------ Left atrium:  The atrium was normal in size.  ------------------------------------------------------------ Right ventricle:  The cavity size was mildly dilated. Systolic function was normal.  ------------------------------------------------------------ Pulmonic valve:    The valve appears to be grossly normal.  Doppler:   No significant regurgitation.  ------------------------------------------------------------ Tricuspid valve:   Structurally normal valve.   Leaflet separation was normal.  Doppler:  Transvalvular velocity was within the normal range.  No regurgitation.  ------------------------------------------------------------ Pulmonary artery:    Systolic pressure could not be accurately estimated.  ------------------------------------------------------------ Systemic veins: Inferior vena cava: The vessel was normal in size; the respirophasic diameter changes were in the normal range (= 50%); findings are consistent with normal central  venous pressure.  ------------------------------------------------------------ Post procedure conclusions Ascending Aorta:  - The aorta was poorly  visualized.  ------------------------------------------------------------  2D measurements        Normal  Doppler measurements   Normal Left ventricle                 Left ventricle LVID ED,     47 mm     43-52   Ea, lat ann,  8.7 cm/s ------ chord,                         tiss DP         7 PLAX                           E/Ea, lat     7.4      ------ LVID ES,     29 mm     23-38   ann, tiss DP    3 chord,                         Ea, med ann,  7.3 cm/s ------ PLAX                           tiss DP         1 FS, chord,   38 %      >29     E/Ea, med     8.9      ------ PLAX                           ann, tiss DP    2 LVPW, ED     13 mm     ------  Mitral valve IVS/LVPW   1.23        <1.3    Peak E vel    65. cm/s ------ ratio, ED                                      2 Ventricular septum             Peak A vel    99. cm/s ------ IVS, ED      16 mm     ------                  2 Aorta                          Deceleration  197 ms   150-23 Root diam,   37 mm     ------  time                   0 ED                             Peak E/A      0.7      ------ AAo AP       38 mm     ------  ratio diam, S                        Systemic veins Left atrium  Estimated CVP  10 mm   ------ AP dim       35 mm     ------                    Hg AP dim     1.62 cm/m^2 <2.2 index Vol, S       51 ml     ------ Vol index, 23.6 ml/m^2 ------ S LEFT HIP - COMPLETE 2+ VIEW   COMPARISON:  None.   FINDINGS: There is a transcervical fracture through the left femoral neck, with mild superior displacement of the distal femur, and mild rotation of the femoral head. The left femoral head remains seated at the acetabulum.   The right hip joint is grossly unremarkable in appearance. No significant degenerative change is appreciated. The sacroiliac joints are unremarkable in appearance.   The visualized bowel gas pattern is grossly unremarkable in appearance.   IMPRESSION: Transcervical  fracture through the left femoral neck, with mild superior displacement of the distal femur, and mild rotation of the femoral head.     CHEST - 1 VIEW   COMPARISON:  Chest radiograph performed 04/15/2011   FINDINGS: The lungs are well-aerated. Pulmonary vascularity is at the upper limits of normal. There is no evidence of focal opacification, pleural effusion or pneumothorax. Minimal left-sided peripheral density is thought to reflect overlying soft tissues.   The cardiomediastinal silhouette is within normal limits. No acute osseous abnormalities are seen. Cervical spinal fusion hardware is noted. There is slight chronic irregularity of the distal left clavicle.   IMPRESSION: No acute cardiopulmonary process seen. No displaced rib fractures identified. LEFT ELBOW - COMPLETE 3+ VIEW   COMPARISON:  None.   FINDINGS: There is no evidence of fracture, dislocation, or joint effusion. There is no evidence of arthropathy or other focal bone abnormality. Soft tissues are unremarkable.   IMPRESSION: Negative. LEFT HAND - COMPLETE 3+ VIEW   COMPARISON:  None.   FINDINGS: Diffuse osteopenia.  No fracture or dislocation.   IMPRESSION: No acute findings   LEFT HIP - COMPLETE 2+ VIEW   COMPARISON:  None.   FINDINGS: Left hip hemiarthroplasty identified.   No fracture, subluxation or dislocation noted.   No complicating features identified.   IMPRESSION: Left hip hemiarthroplasty without complicating features.    ASSESSMENT/PLAN:  Left femoral neck fracture-status post arthroplasty. Continue rehabilitation. Seizure disorder-well-controlled COPD-compensated Diabetes mellitus-continue metformin and Amaryl Acute blood loss anemia-check hemoglobin level CHF-compensated Leucocytosis-recheck Check CBC with differential  I have reviewed patient's medical records received at admission/from hospitalization.  CPT CODE: 16109  Angela Cox, MD [facility] [phone]

## 2013-11-24 ENCOUNTER — Non-Acute Institutional Stay (SKILLED_NURSING_FACILITY): Payer: 59 | Admitting: Adult Health

## 2013-11-24 ENCOUNTER — Encounter: Payer: Self-pay | Admitting: Adult Health

## 2013-11-24 ENCOUNTER — Other Ambulatory Visit: Payer: Self-pay | Admitting: *Deleted

## 2013-11-24 DIAGNOSIS — G40909 Epilepsy, unspecified, not intractable, without status epilepticus: Secondary | ICD-10-CM

## 2013-11-24 DIAGNOSIS — M109 Gout, unspecified: Secondary | ICD-10-CM

## 2013-11-24 DIAGNOSIS — S72009D Fracture of unspecified part of neck of unspecified femur, subsequent encounter for closed fracture with routine healing: Secondary | ICD-10-CM

## 2013-11-24 DIAGNOSIS — I509 Heart failure, unspecified: Secondary | ICD-10-CM

## 2013-11-24 DIAGNOSIS — I10 Essential (primary) hypertension: Secondary | ICD-10-CM

## 2013-11-24 DIAGNOSIS — S72002D Fracture of unspecified part of neck of left femur, subsequent encounter for closed fracture with routine healing: Secondary | ICD-10-CM

## 2013-11-24 DIAGNOSIS — D62 Acute posthemorrhagic anemia: Secondary | ICD-10-CM

## 2013-11-24 DIAGNOSIS — E1165 Type 2 diabetes mellitus with hyperglycemia: Secondary | ICD-10-CM

## 2013-11-24 DIAGNOSIS — E785 Hyperlipidemia, unspecified: Secondary | ICD-10-CM

## 2013-11-24 DIAGNOSIS — IMO0001 Reserved for inherently not codable concepts without codable children: Secondary | ICD-10-CM

## 2013-11-24 DIAGNOSIS — J449 Chronic obstructive pulmonary disease, unspecified: Secondary | ICD-10-CM

## 2013-11-24 DIAGNOSIS — I5032 Chronic diastolic (congestive) heart failure: Secondary | ICD-10-CM

## 2013-11-24 MED ORDER — PHENOBARBITAL 97.2 MG PO TABS: ORAL_TABLET | ORAL | Status: DC

## 2013-11-24 NOTE — Telephone Encounter (Signed)
[facility] 

## 2013-11-24 NOTE — Progress Notes (Signed)
Patient ID: Keith Wise, male   DOB: June 02, 1940, 73 y.o.   MRN: [id number]              PROGRESS NOTE  DATE:    11/24/13  FACILITY: Nursing Home Location: [facility]  LEVEL OF CARE: SNF (31)  Acute Visit  CHIEF COMPLAINT:  Discharge Notes  HISTORY OF PRESENT ILLNESS: This is a 73 year old male who is for discharge home with home health PT and OT.  He has been admitted to [facility] on 11/04/13 from [facility] with Left displaced femoral neck fracture S/P arthroplasty. Patient was admitted to this facility for short-term rehabilitation after the patient's recent hospitalization.  Patient has completed SNF rehabilitation and therapy has cleared the patient for discharge.  REASSESSMENT OF ONGOING PROBLEM(S):  HTN: Pt 's HTN remains stable.  Denies CP, sob, DOE, pedal edema, headaches, dizziness or visual disturbances.  No complications from the medications currently being used.  Last BP : 116/74  SEIZURE DISORDER: The patient's seizure disorder remains stable. No complications reported from the medications presently being used. Staff do not report any recent seizure activity.  GOUT: Patien'st  gout remains stable. Patient denies joint pan, redness, swelling or warmth. No complications reported from the medications presently being used.   PAST MEDICAL HISTORY : Reviewed.  No changes/see problem list  CURRENT MEDICATIONS: Reviewed per MAR/see medication list  REVIEW OF SYSTEMS:  GENERAL: no change in appetite, no fatigue, no weight changes, no fever, chills or weakness RESPIRATORY: no cough, SOB, DOE, wheezing, hemoptysis CARDIAC: no chest pain, edema or palpitations GI: no abdominal pain, diarrhea, constipation, heart burn, nausea or vomiting  PHYSICAL EXAMINATION  GENERAL: no acute distress, obese NECK: supple, trachea midline, no neck masses, no thyroid tenderness, no thyromegaly LYMPHATICS: no LAN in the neck, no supraclavicular LAN RESPIRATORY:  breathing is even & unlabored, BS CTAB CARDIAC: RRR, no murmur,no extra heart sounds, no edema GI: abdomen soft, normal BS, no masses, no tenderness, no hepatomegaly, no splenomegaly EXTREMITIES:  Able to move all 4 extremities ; ambulates PSYCHIATRIC: the patient is alert & oriented to person, affect & behavior appropriate  LABS/RADIOLOGY: 11/06/13  WBC 9.7 hemoglobin 11.8 hematocrit 35.3 phenobarbital 12.4 Labs reviewed: Basic Metabolic Panel:  Recent Labs  40/98/11 2156 11/02/13 1515 11/03/13 0437  NA 136* 132* 130*  K 4.6 4.3 4.3  CL 100 96 94*  CO2 GLUCOSE 118* 205* 157*  BUN CREATININE 0.71 0.78 0.70  CALCIUM 9.0 9.0 9.1   CBC:  Recent Labs  10/31/13 2156 11/02/13 1515 11/03/13 0437 11/04/13 0535  WBC 14.5* 15.0* 14.2* 12.3*  NEUTROABS 11.7*  --   --   --   HGB 14.1 12.9* 12.8* 12.2*  HCT 40.3 38.2* 36.7* 35.2*  MCV 97.1 96.7 96.1 96.4  PLT 259 224 216 230   CBG:  Recent Labs  11/03/13 2212 11/04/13 0633 11/04/13 1124  GLUCAP 176* 142* 170*    EXAM: LEFT HIP - COMPLETE 2+ VIEW   COMPARISON:  None.   FINDINGS: There is a transcervical fracture through the left femoral neck, with mild superior displacement of the distal femur, and mild rotation of the femoral head. The left femoral head remains seated at the acetabulum.   The right hip joint is grossly unremarkable in appearance. No significant degenerative change is appreciated. The sacroiliac joints are unremarkable in appearance.   The visualized bowel gas pattern is grossly unremarkable in appearance.  IMPRESSION: Transcervical fracture through the left femoral neck, with mild superior displacement of the distal femur, and mild rotation of the femoral head. EXAM: CHEST - 1 VIEW   COMPARISON:  Chest radiograph performed 04/15/2011   FINDINGS: The lungs are well-aerated. Pulmonary vascularity is at the upper limits of normal. There is no evidence of focal  opacification, pleural effusion or pneumothorax. Minimal left-sided peripheral density is thought to reflect overlying soft tissues.   The cardiomediastinal silhouette is within normal limits. No acute osseous abnormalities are seen. Cervical spinal fusion hardware is noted. There is slight chronic irregularity of the distal left clavicle.   IMPRESSION: No acute cardiopulmonary process seen. No displaced rib fractures identified. EXAM: LEFT ELBOW - COMPLETE 3+ VIEW   COMPARISON:  None.   FINDINGS: There is no evidence of fracture, dislocation, or joint effusion. There is no evidence of arthropathy or other focal bone abnormality. Soft tissues are unremarkable.   IMPRESSION: Negative. EXAM: LEFT HAND - COMPLETE 3+ VIEW   COMPARISON:  None.   FINDINGS: Diffuse osteopenia.  No fracture or dislocation.   IMPRESSION: No acute findings EXAM: LEFT HIP - COMPLETE 2+ VIEW   COMPARISON:  None.   FINDINGS: Left hip hemiarthroplasty identified.   No fracture, subluxation or dislocation noted.   No complicating features identified.   IMPRESSION: Left hip hemiarthroplasty without complicating features.      ASSESSMENT/PLAN:  Left displaced femoral neck fracture S/P Arthroplasty - for home health PT and OT  Diabetes Mellitus, type 2 - well-controlled; continue Glucophage and Amaryl Hyperlipidemia - continue Lipitor Seizure disorder - continue Phenobarbital Chronic diastolic CHF - stable Anemia, acute blood loss - stable Gout - continue Colchicine and Allopurinol Hypertension - well-controlled; continue Lisinopril  I have filled out patient's discharge paperwork and written prescriptions.  Patient will receive home health PT and OT.   Total discharge time: Greater than 30 minutes  Discharge time involved coordination of the discharge process with social worker, nursing staff and therapy department. Medical justification for home health services/ verified.   CPT  CODE: 29562   Ella Bodo - NP [facility] [phone]

## 2013-12-23 ENCOUNTER — Other Ambulatory Visit: Payer: Self-pay | Admitting: Adult Health

## 2013-12-31 ENCOUNTER — Other Ambulatory Visit: Payer: Self-pay | Admitting: *Deleted

## 2014-01-05 ENCOUNTER — Other Ambulatory Visit: Payer: Self-pay | Admitting: Adult Health

## 2014-01-11 ENCOUNTER — Other Ambulatory Visit: Payer: Self-pay | Admitting: Adult Health

## 2014-01-17 ENCOUNTER — Other Ambulatory Visit: Payer: Self-pay | Admitting: Adult Health

## 2014-02-28 ENCOUNTER — Other Ambulatory Visit: Payer: Self-pay | Admitting: Adult Health

## 2014-02-28 ENCOUNTER — Other Ambulatory Visit: Payer: Self-pay | Admitting: Internal Medicine

## 2014-03-24 ENCOUNTER — Other Ambulatory Visit: Payer: Self-pay | Admitting: Adult Health

## 2014-04-20 ENCOUNTER — Other Ambulatory Visit: Payer: Self-pay | Admitting: Internal Medicine

## 2014-04-29 ENCOUNTER — Other Ambulatory Visit: Payer: Self-pay | Admitting: Internal Medicine

## 2014-05-30 ENCOUNTER — Other Ambulatory Visit: Payer: Self-pay | Admitting: Internal Medicine

## 2014-05-30 ENCOUNTER — Other Ambulatory Visit: Payer: Self-pay | Admitting: Adult Health

## 2014-06-04 ENCOUNTER — Other Ambulatory Visit (HOSPITAL_COMMUNITY): Payer: Self-pay | Admitting: Family Medicine

## 2014-06-04 DIAGNOSIS — M6281 Muscle weakness (generalized): Secondary | ICD-10-CM

## 2014-06-07 ENCOUNTER — Other Ambulatory Visit (HOSPITAL_COMMUNITY): Payer: Self-pay | Admitting: Family Medicine

## 2014-06-07 ENCOUNTER — Ambulatory Visit (HOSPITAL_COMMUNITY)
Admission: RE | Admit: 2014-06-07 | Discharge: 2014-06-07 | Disposition: A | Discharge status: Home / Self Care | Payer: 59 | Source: Ambulatory Visit | Attending: Family Medicine | Admitting: Family Medicine

## 2014-06-07 DIAGNOSIS — M6281 Muscle weakness (generalized): Secondary | ICD-10-CM | POA: Insufficient documentation

## 2014-06-07 NOTE — Progress Notes (Signed)
VASCULAR LAB PRELIMINARY  ARTERIAL  ABI completed:  ABIs and pedal waveforms normal.    RIGHT    LEFT    PRESSURE WAVEFORM  PRESSURE WAVEFORM  BRACHIAL 132   Triphasic  BRACHIAL 138 Triphasic   DP 139 Triphasic  DP 142 Triphasic   AT   AT    PT 148 Triphasic  PT 148 Triphasic   PER   PER    GREAT TOE  NA GREAT TOE  NA    RIGHT LEFT  ABI 1.07 1.07     Lorraine Terriquez, RVT 06/07/2014, 2:15 PM  132

## 2014-06-23 ENCOUNTER — Other Ambulatory Visit: Payer: Self-pay | Admitting: Internal Medicine

## 2014-06-24 ENCOUNTER — Other Ambulatory Visit: Payer: Self-pay | Admitting: Nurse Practitioner

## 2015-07-03 IMAGING — CR DG ELBOW COMPLETE 3+V*L*
4 series · 4 of 4 positions shown · non-contrast
Comparison: None.

CLINICAL DATA: Fell, left elbow pain

EXAM:
LEFT ELBOW - COMPLETE 3+ VIEW

[view not recorded (1 of 4)]
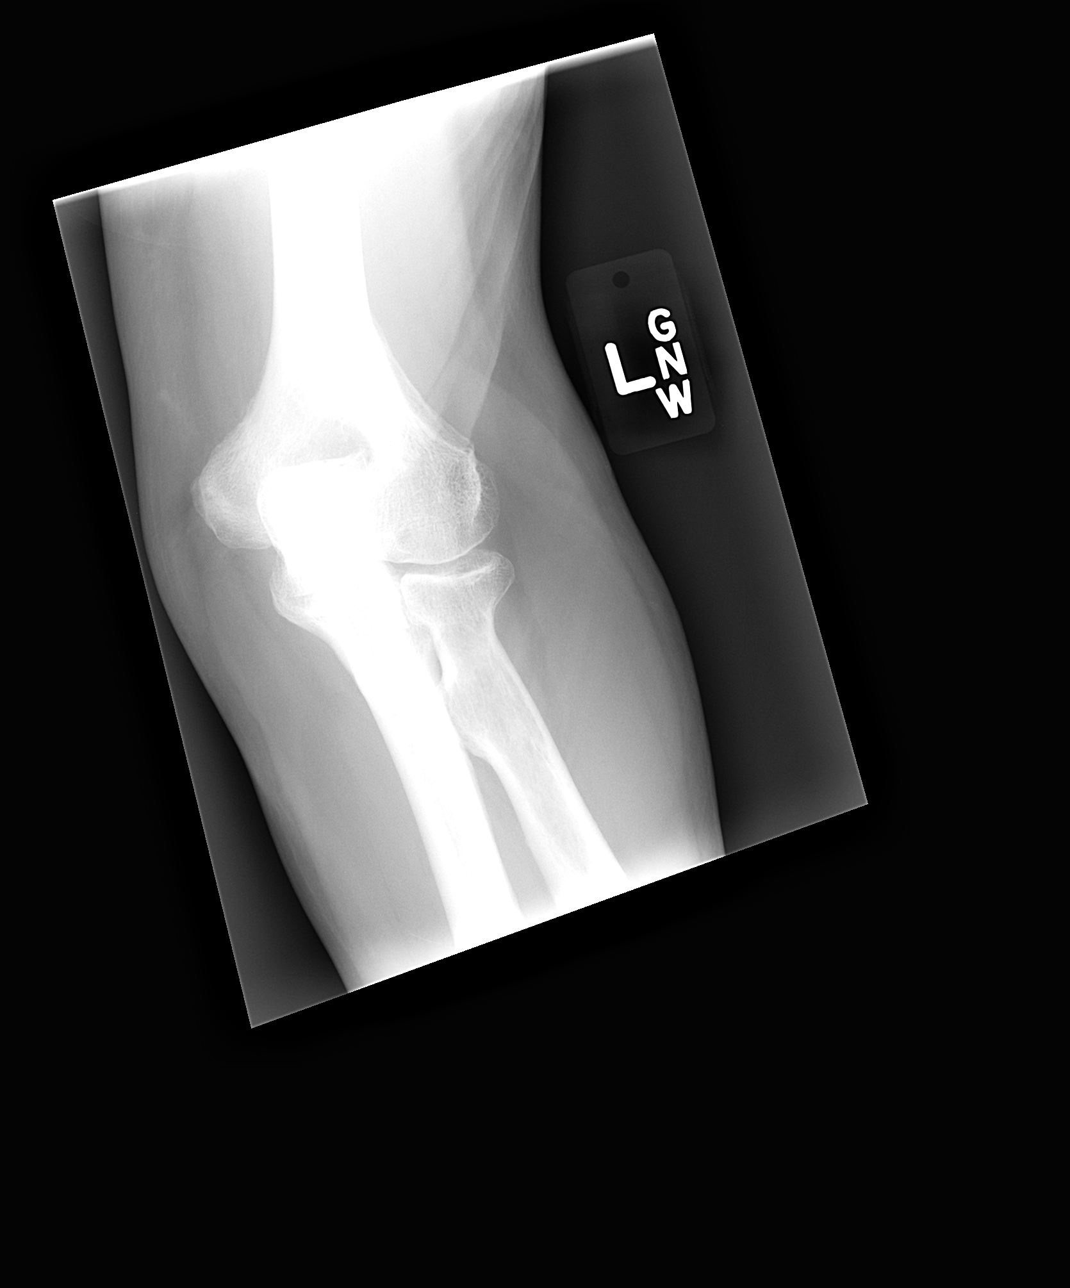

[view not recorded (2 of 4)]
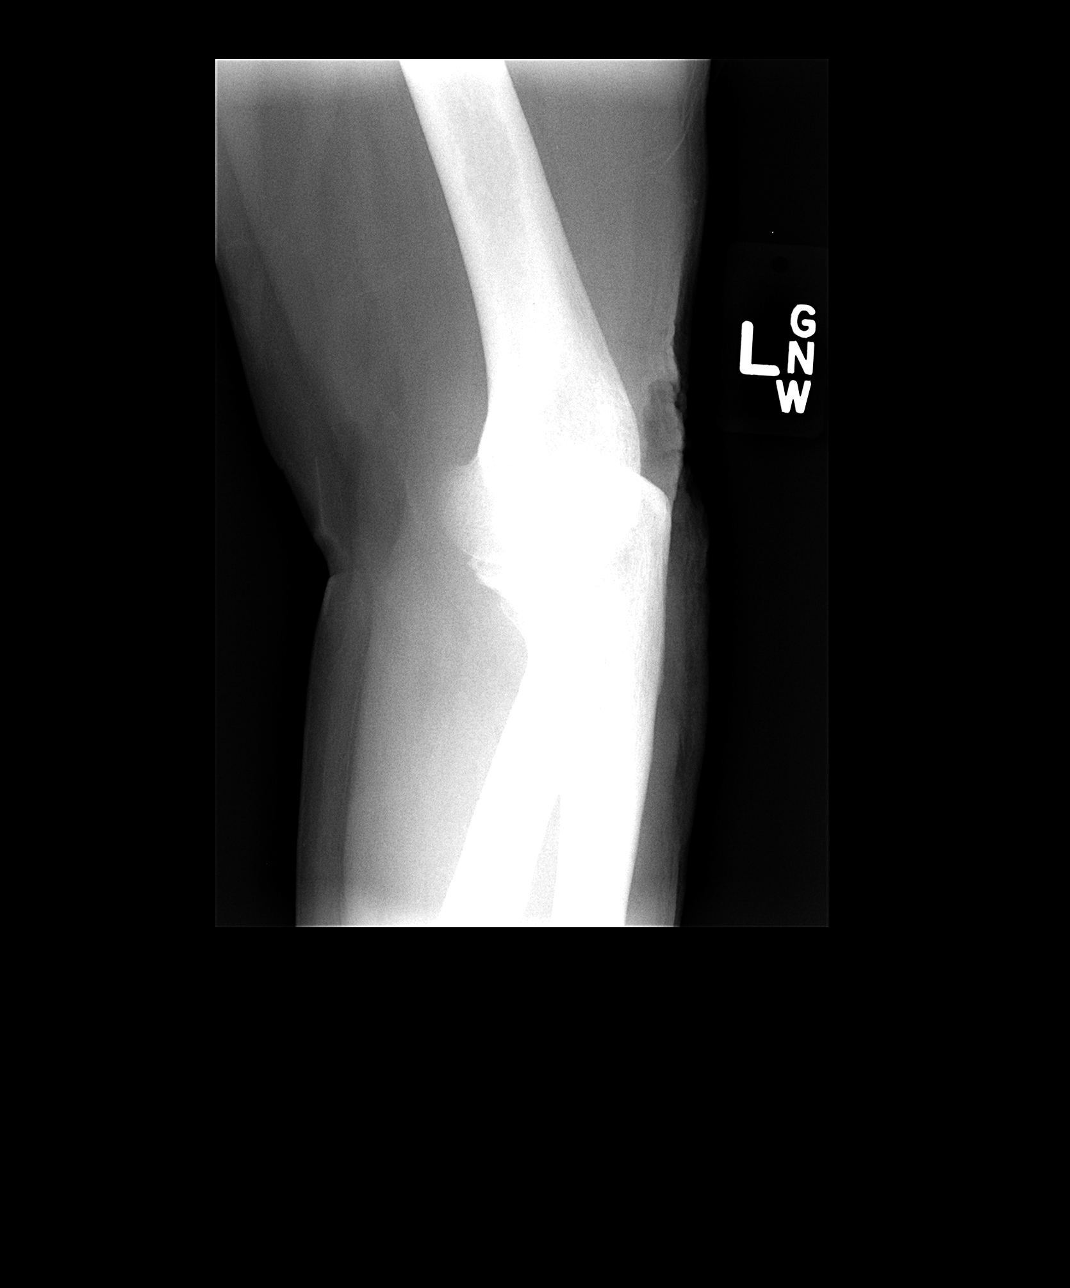

[view not recorded (3 of 4)]
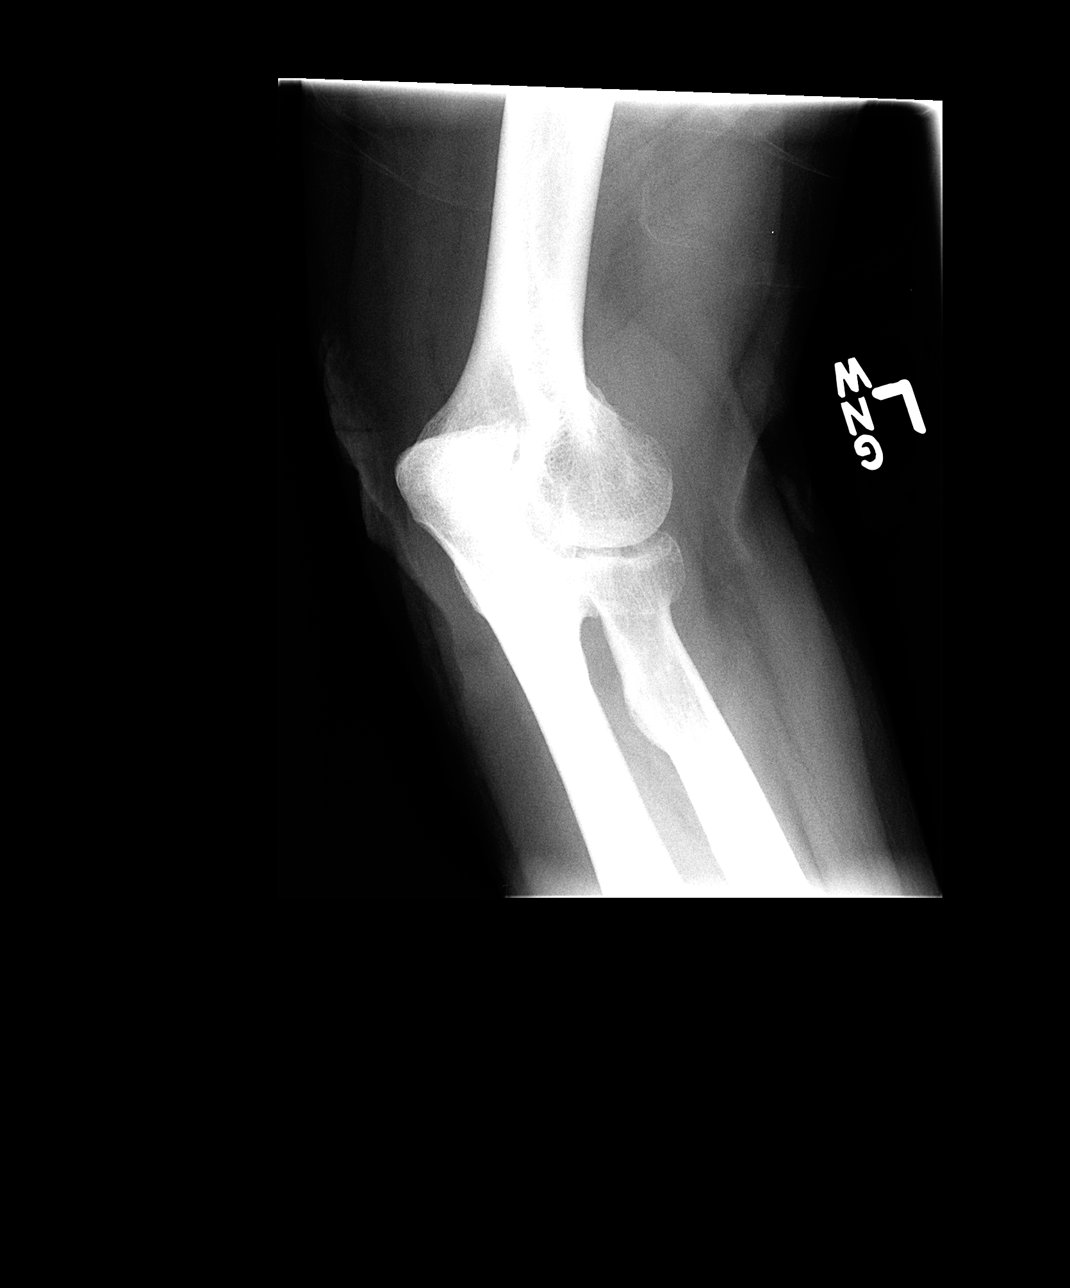

[view not recorded (4 of 4)]
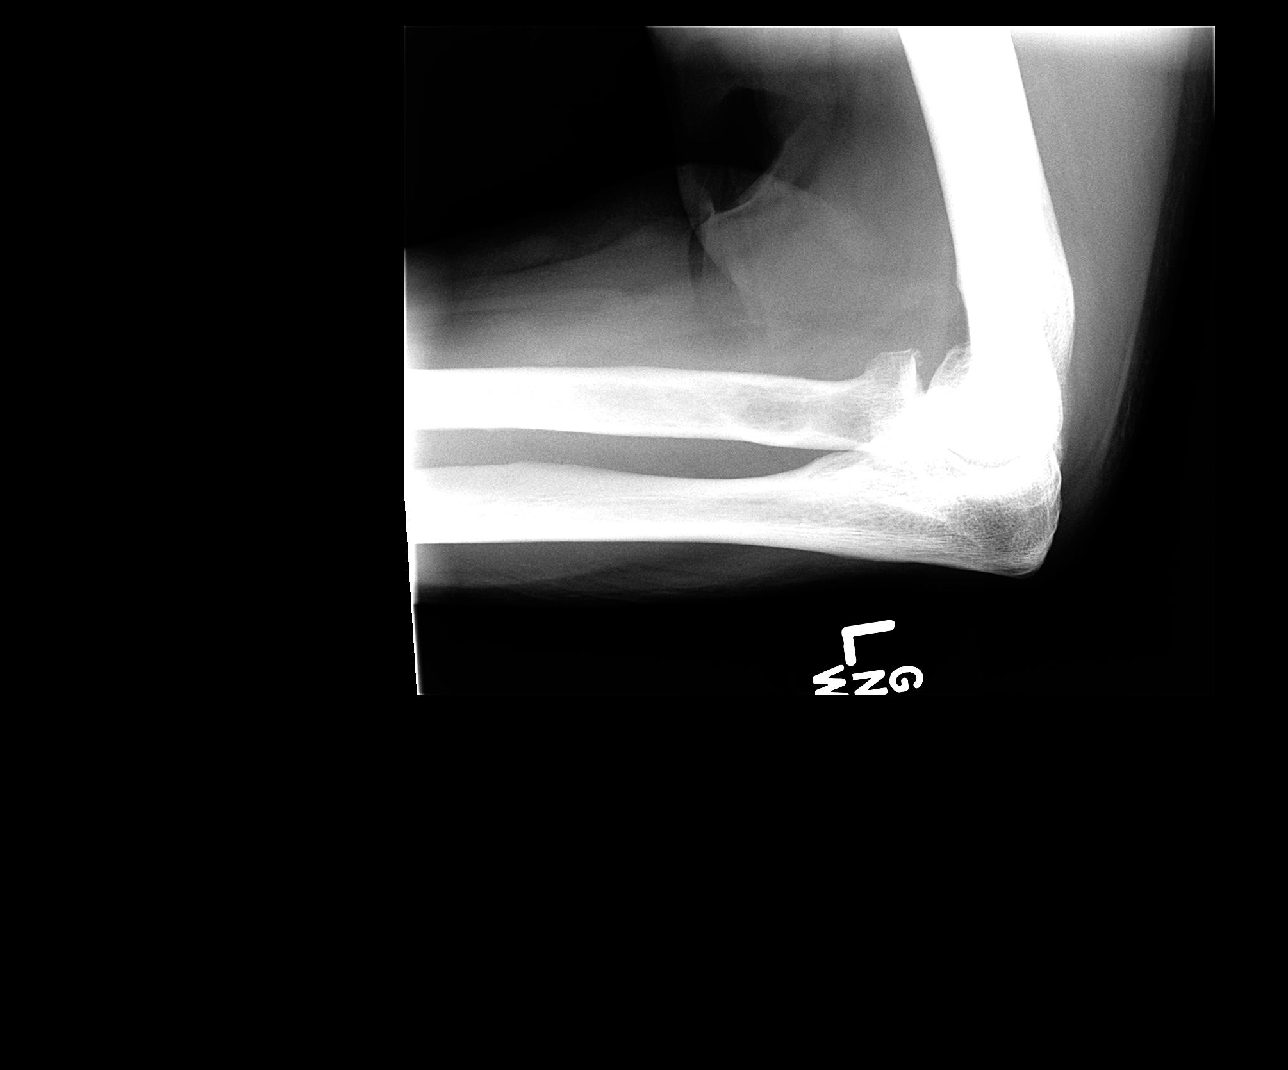

[4 of 4 positions shown; findings below may reference images not displayed]

FINDINGS: There is no evidence of fracture, dislocation, or joint effusion.
There is no evidence of arthropathy or other focal bone abnormality.
Soft tissues are unremarkable.
IMPRESSION: Negative.

## 2015-07-04 IMAGING — CR DG HIP (WITH OR WITHOUT PELVIS) 2-3V*L*
3 series · 3 of 3 positions shown · non-contrast
Comparison: None.

CLINICAL DATA: 73-year-old male status post left hip
hemiarthroplasty

EXAM:
LEFT HIP - COMPLETE 2+ VIEW

[AP (1 of 2)]
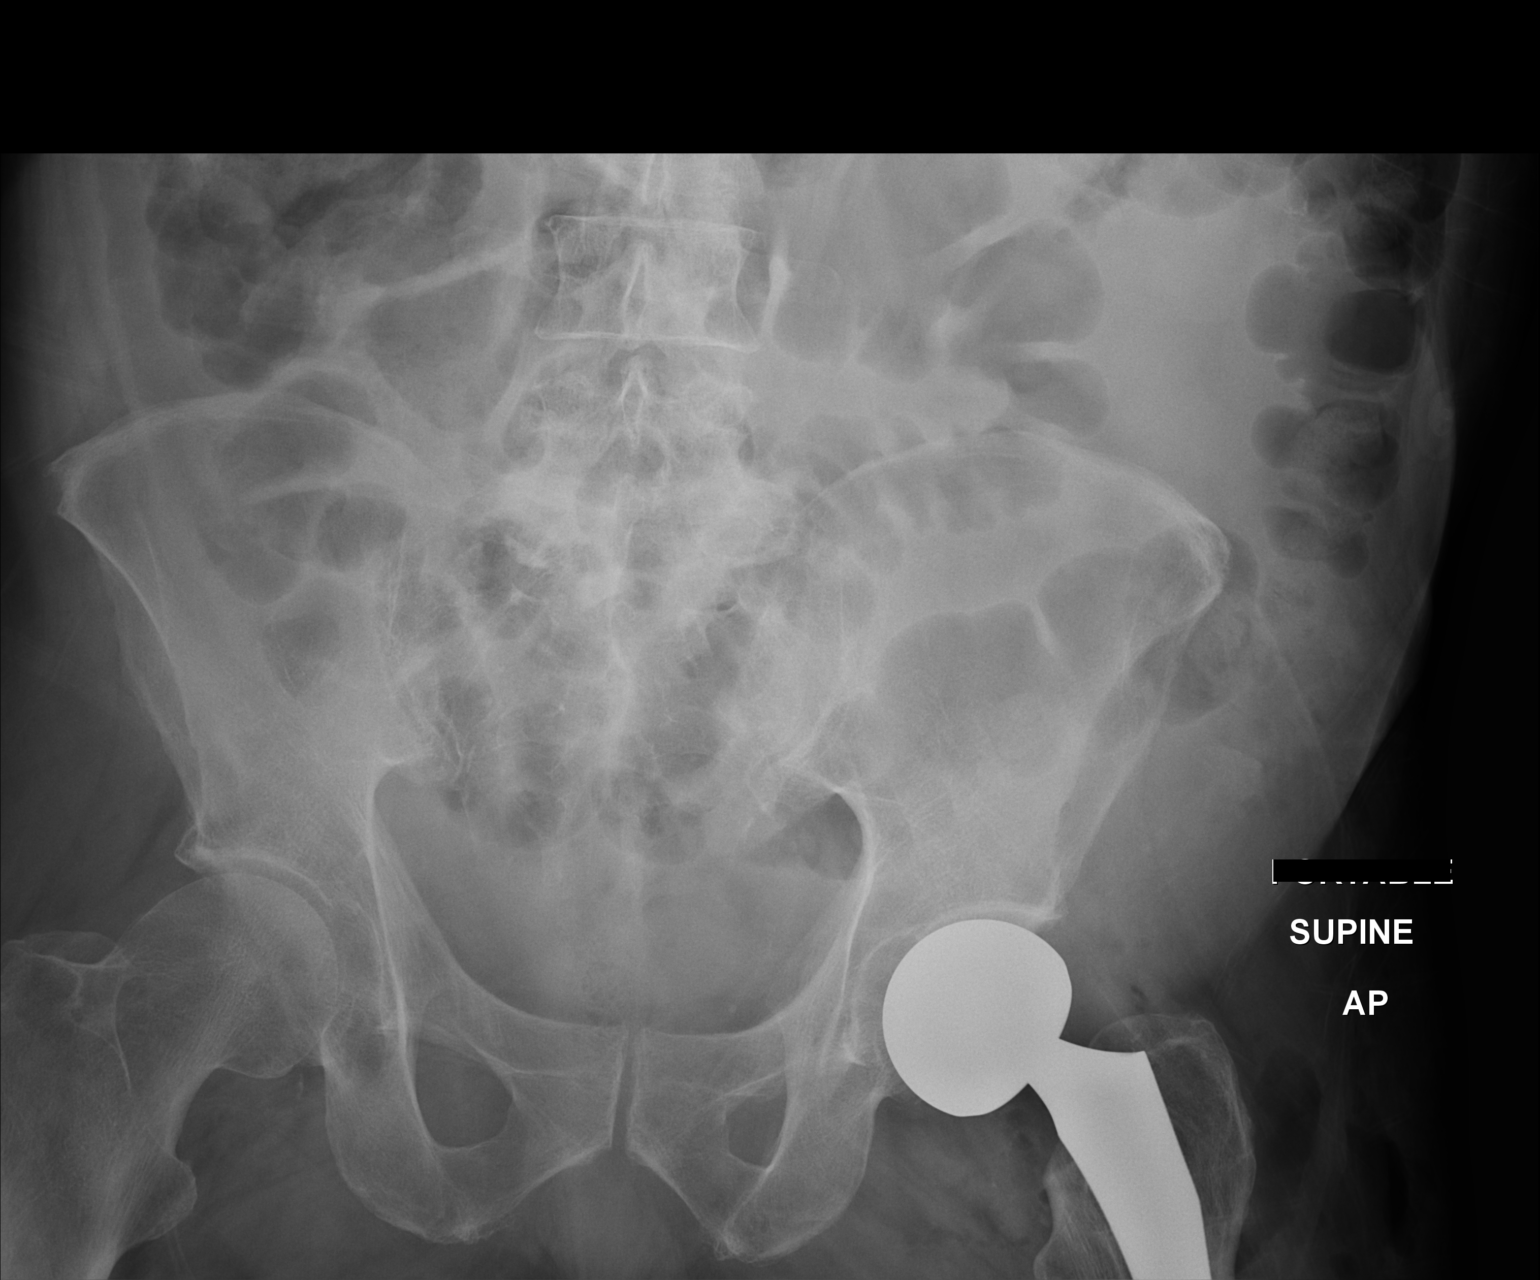

[AP (2 of 2)]
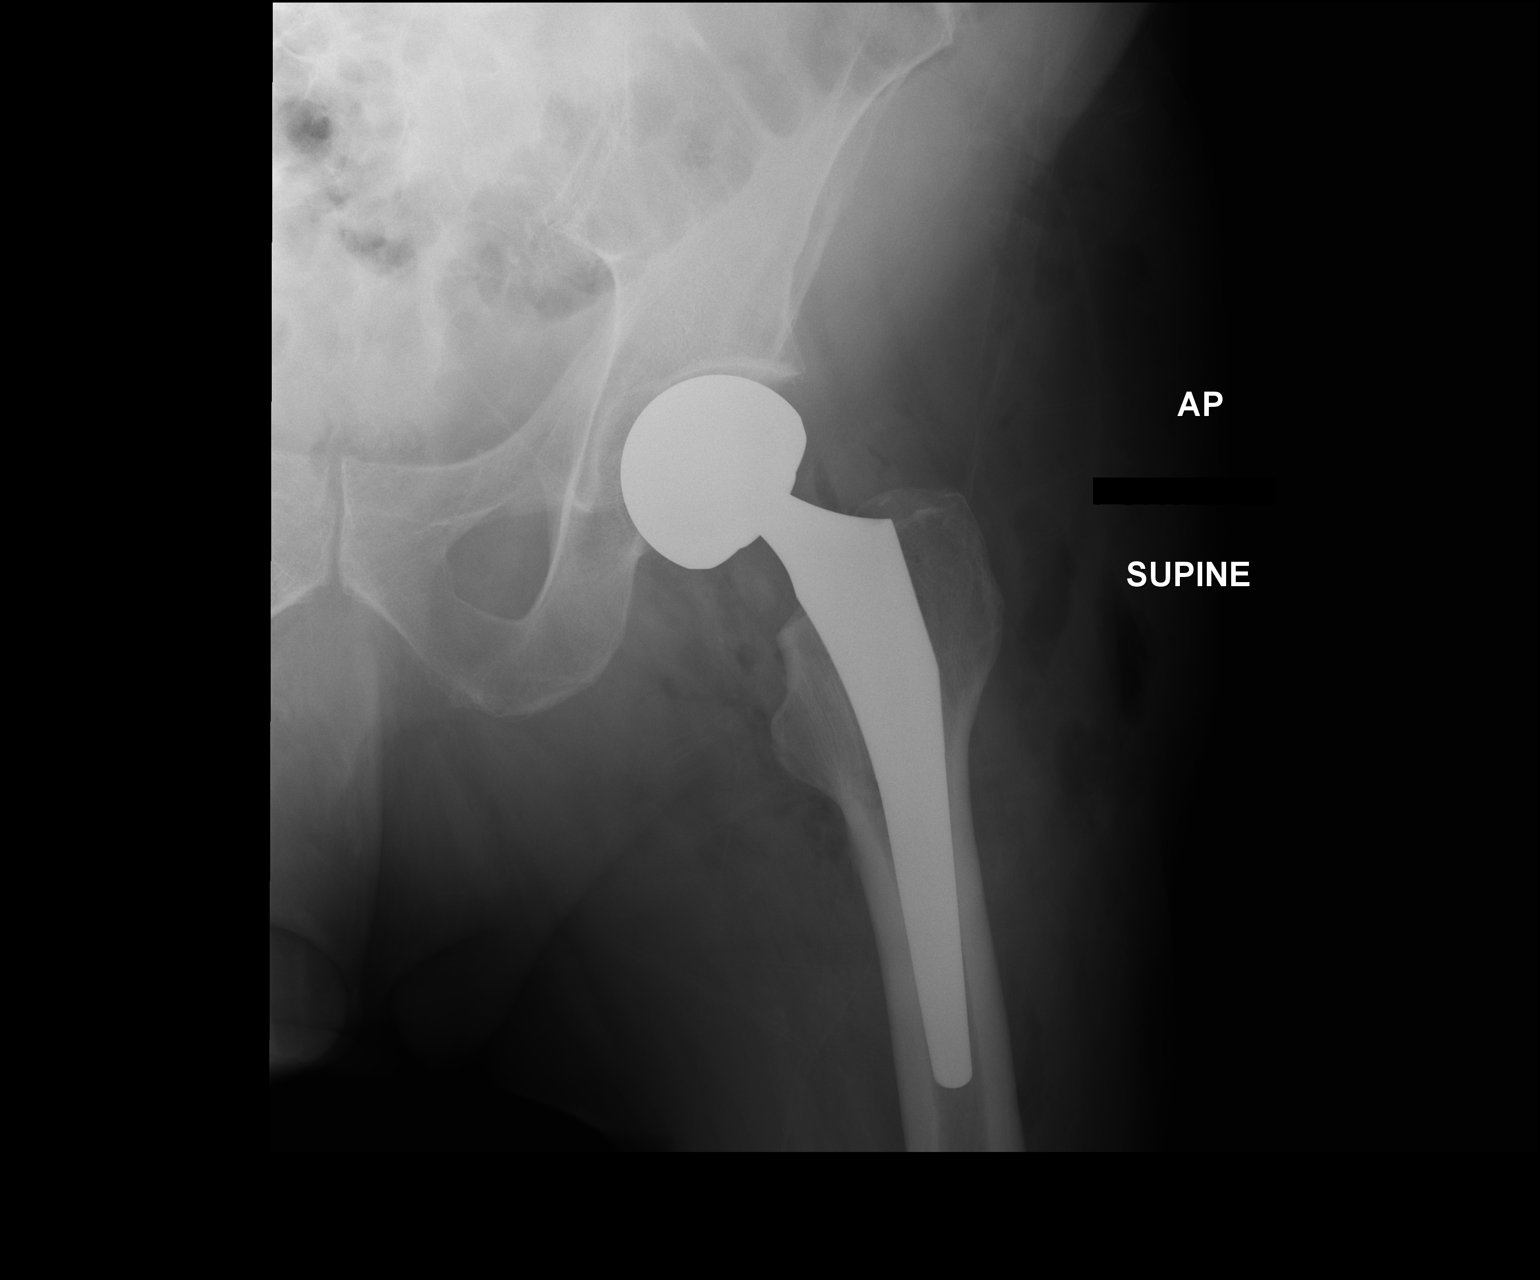

[xtable lateral]
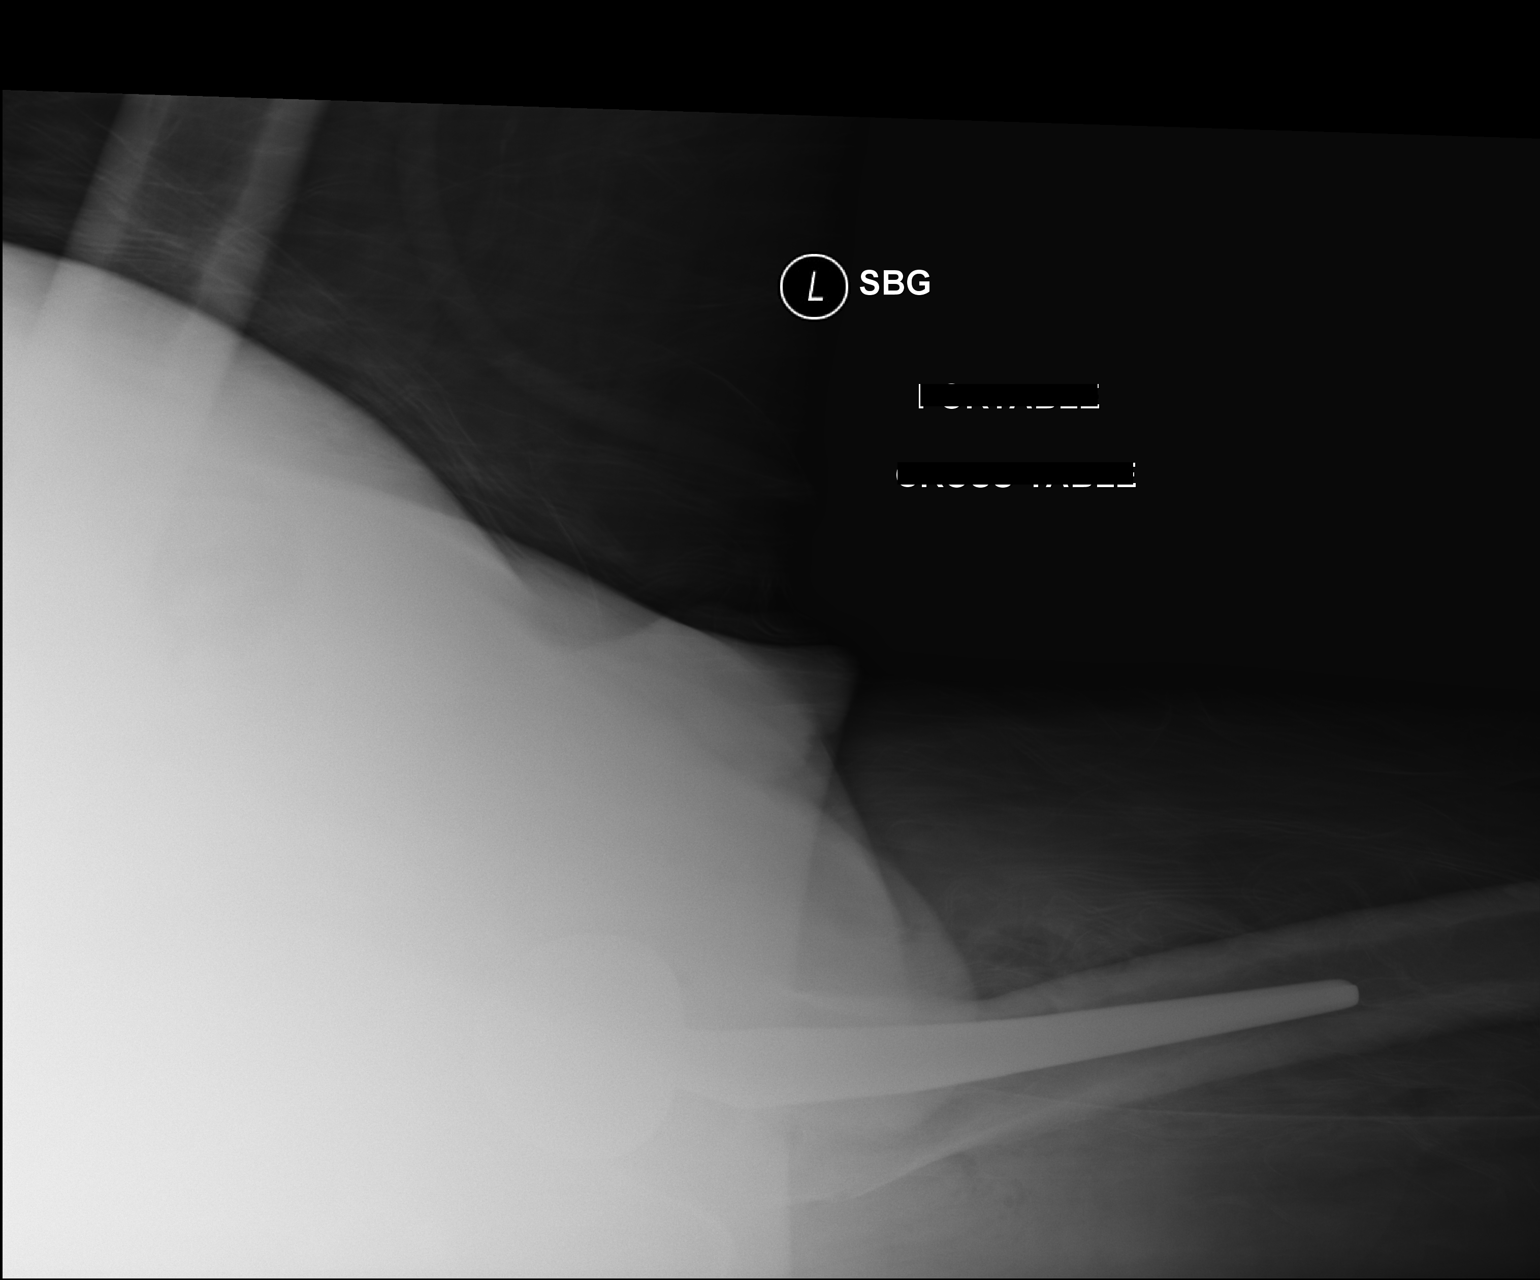

[3 of 3 positions shown; findings below may reference images not displayed]

FINDINGS: Left hip hemiarthroplasty identified.

No fracture, subluxation or dislocation noted.

No complicating features identified.
IMPRESSION: Left hip hemiarthroplasty without complicating features.

## 2017-08-05 ENCOUNTER — Other Ambulatory Visit: Payer: Self-pay | Admitting: Family Medicine

## 2017-08-05 DIAGNOSIS — Z129 Encounter for screening for malignant neoplasm, site unspecified: Secondary | ICD-10-CM

## 2018-10-08 ENCOUNTER — Other Ambulatory Visit: Payer: Self-pay

## 2018-10-08 ENCOUNTER — Encounter (HOSPITAL_COMMUNITY): Payer: Self-pay | Admitting: Emergency Medicine

## 2018-10-08 ENCOUNTER — Inpatient Hospital Stay (HOSPITAL_COMMUNITY)
Admission: EM | Admit: 2018-10-08 | Discharge: 2018-10-13 | DRG: 194 | Disposition: A | Discharge status: Skilled Nursing Facility | Payer: Medicare PPO | Attending: Internal Medicine | Admitting: Internal Medicine

## 2018-10-08 DIAGNOSIS — J189 Pneumonia, unspecified organism: Principal | ICD-10-CM | POA: Diagnosis present

## 2018-10-08 DIAGNOSIS — Z7984 Long term (current) use of oral hypoglycemic drugs: Secondary | ICD-10-CM

## 2018-10-08 DIAGNOSIS — E785 Hyperlipidemia, unspecified: Secondary | ICD-10-CM | POA: Diagnosis present

## 2018-10-08 DIAGNOSIS — Z7401 Bed confinement status: Secondary | ICD-10-CM

## 2018-10-08 DIAGNOSIS — E7800 Pure hypercholesterolemia, unspecified: Secondary | ICD-10-CM | POA: Diagnosis present

## 2018-10-08 DIAGNOSIS — J440 Chronic obstructive pulmonary disease with acute lower respiratory infection: Secondary | ICD-10-CM | POA: Diagnosis present

## 2018-10-08 DIAGNOSIS — E119 Type 2 diabetes mellitus without complications: Secondary | ICD-10-CM | POA: Diagnosis present

## 2018-10-08 DIAGNOSIS — G8929 Other chronic pain: Secondary | ICD-10-CM | POA: Diagnosis present

## 2018-10-08 DIAGNOSIS — Z79899 Other long term (current) drug therapy: Secondary | ICD-10-CM

## 2018-10-08 DIAGNOSIS — Z791 Long term (current) use of non-steroidal anti-inflammatories (NSAID): Secondary | ICD-10-CM

## 2018-10-08 DIAGNOSIS — Z7289 Other problems related to lifestyle: Secondary | ICD-10-CM

## 2018-10-08 DIAGNOSIS — I10 Essential (primary) hypertension: Secondary | ICD-10-CM | POA: Diagnosis present

## 2018-10-08 DIAGNOSIS — W19XXXA Unspecified fall, initial encounter: Secondary | ICD-10-CM | POA: Diagnosis present

## 2018-10-08 DIAGNOSIS — R531 Weakness: Secondary | ICD-10-CM | POA: Diagnosis not present

## 2018-10-08 DIAGNOSIS — R29898 Other symptoms and signs involving the musculoskeletal system: Secondary | ICD-10-CM | POA: Diagnosis present

## 2018-10-08 DIAGNOSIS — Z7982 Long term (current) use of aspirin: Secondary | ICD-10-CM

## 2018-10-08 DIAGNOSIS — Y92009 Unspecified place in unspecified non-institutional (private) residence as the place of occurrence of the external cause: Secondary | ICD-10-CM

## 2018-10-08 DIAGNOSIS — Z833 Family history of diabetes mellitus: Secondary | ICD-10-CM

## 2018-10-08 DIAGNOSIS — M109 Gout, unspecified: Secondary | ICD-10-CM | POA: Diagnosis present

## 2018-10-08 DIAGNOSIS — Z981 Arthrodesis status: Secondary | ICD-10-CM

## 2018-10-08 DIAGNOSIS — Z8673 Personal history of transient ischemic attack (TIA), and cerebral infarction without residual deficits: Secondary | ICD-10-CM

## 2018-10-08 DIAGNOSIS — R6510 Systemic inflammatory response syndrome (SIRS) of non-infectious origin without acute organ dysfunction: Secondary | ICD-10-CM | POA: Diagnosis present

## 2018-10-08 DIAGNOSIS — R296 Repeated falls: Secondary | ICD-10-CM | POA: Diagnosis present

## 2018-10-08 DIAGNOSIS — M4712 Other spondylosis with myelopathy, cervical region: Secondary | ICD-10-CM | POA: Diagnosis present

## 2018-10-08 DIAGNOSIS — M50221 Other cervical disc displacement at C4-C5 level: Secondary | ICD-10-CM | POA: Diagnosis present

## 2018-10-08 DIAGNOSIS — J449 Chronic obstructive pulmonary disease, unspecified: Secondary | ICD-10-CM | POA: Diagnosis present

## 2018-10-08 DIAGNOSIS — Z96642 Presence of left artificial hip joint: Secondary | ICD-10-CM | POA: Diagnosis present

## 2018-10-08 DIAGNOSIS — Z66 Do not resuscitate: Secondary | ICD-10-CM | POA: Diagnosis present

## 2018-10-08 DIAGNOSIS — F17210 Nicotine dependence, cigarettes, uncomplicated: Secondary | ICD-10-CM | POA: Diagnosis present

## 2018-10-08 DIAGNOSIS — I110 Hypertensive heart disease with heart failure: Secondary | ICD-10-CM | POA: Diagnosis present

## 2018-10-08 DIAGNOSIS — I5032 Chronic diastolic (congestive) heart failure: Secondary | ICD-10-CM | POA: Diagnosis present

## 2018-10-08 DIAGNOSIS — Z20828 Contact with and (suspected) exposure to other viral communicable diseases: Secondary | ICD-10-CM | POA: Diagnosis present

## 2018-10-08 LAB — URINALYSIS, ROUTINE W REFLEX MICROSCOPIC
Bilirubin Urine: NEGATIVE
Glucose, UA: NEGATIVE mg/dL
Hgb urine dipstick: NEGATIVE
Ketones, ur: NEGATIVE mg/dL
Leukocytes,Ua: NEGATIVE
Nitrite: NEGATIVE
Protein, ur: NEGATIVE mg/dL
Specific Gravity, Urine: 1.014 (ref 1.005–1.030)
pH: 5 (ref 5.0–8.0)

## 2018-10-08 LAB — COMPREHENSIVE METABOLIC PANEL
ALT: 12 U/L (ref 0–44)
AST: 17 U/L (ref 15–41)
Albumin: 3.6 g/dL (ref 3.5–5.0)
Alkaline Phosphatase: 68 U/L (ref 38–126)
Anion gap: 11 (ref 5–15)
BUN: 12 mg/dL (ref 8–23)
CO2: 21 mmol/L — ABNORMAL LOW (ref 22–32)
Calcium: 9.9 mg/dL (ref 8.9–10.3)
Chloride: 101 mmol/L (ref 98–111)
Creatinine, Ser: 1.06 mg/dL (ref 0.61–1.24)
GFR calc Af Amer: >60 mL/min (ref >60)
GFR calc non Af Amer: >60 mL/min (ref >60)
Glucose, Bld: 167 mg/dL — ABNORMAL HIGH (ref 70–99)
Potassium: 4.2 mmol/L (ref 3.5–5.1)
Sodium: 133 mmol/L — ABNORMAL LOW (ref 135–145)
Total Bilirubin: 0.6 mg/dL (ref 0.3–1.2)
Total Protein: 6.3 g/dL — ABNORMAL LOW (ref 6.5–8.1)

## 2018-10-08 LAB — CBC WITH DIFFERENTIAL/PLATELET
Abs Immature Granulocytes: 0.15 10*3/uL — ABNORMAL HIGH (ref 0.00–0.07)
Basophils Absolute: 0.1 10*3/uL (ref 0.0–0.1)
Basophils Relative: 0 %
Eosinophils Absolute: 0.1 10*3/uL (ref 0.0–0.5)
Eosinophils Relative: 0 %
HCT: 45.7 % (ref 39.0–52.0)
Hemoglobin: 15.4 g/dL (ref 13.0–17.0)
Immature Granulocytes: 1 %
Lymphocytes Relative: 5 %
Lymphs Abs: 1 10*3/uL (ref 0.7–4.0)
MCH: 34.1 pg — ABNORMAL HIGH (ref 26.0–34.0)
MCHC: 33.7 g/dL (ref 30.0–36.0)
MCV: 101.1 fL — ABNORMAL HIGH (ref 80.0–100.0)
Monocytes Absolute: 1.4 10*3/uL — ABNORMAL HIGH (ref 0.1–1.0)
Monocytes Relative: 8 %
Neutro Abs: 16 10*3/uL — ABNORMAL HIGH (ref 1.7–7.7)
Neutrophils Relative %: 86 %
Platelets: 265 10*3/uL (ref 150–400)
RBC: 4.52 MIL/uL (ref 4.22–5.81)
RDW: 12.8 % (ref 11.5–15.5)
WBC: 18.7 10*3/uL — ABNORMAL HIGH (ref 4.0–10.5)
nRBC: 0 % (ref 0.0–0.2)

## 2018-10-08 LAB — CBG MONITORING, ED: Glucose-Capillary: 156 mg/dL — ABNORMAL HIGH (ref 70–99)

## 2018-10-08 LAB — LACTIC ACID, PLASMA: Lactic Acid, Venous: 1.6 mmol/L (ref 0.5–1.9)

## 2018-10-08 NOTE — ED Triage Notes (Signed)
Patient arrived with EMS from home fell multiple times this week with progressing generalized weakness for the past several months , CBG= 233 , alert and orientedx4 , wife tel. [phone].

## 2018-10-09 ENCOUNTER — Encounter (HOSPITAL_COMMUNITY): Payer: Self-pay | Admitting: Internal Medicine

## 2018-10-09 ENCOUNTER — Inpatient Hospital Stay (HOSPITAL_COMMUNITY): Payer: Medicare PPO

## 2018-10-09 ENCOUNTER — Observation Stay (HOSPITAL_COMMUNITY): Payer: Medicare PPO

## 2018-10-09 ENCOUNTER — Emergency Department (HOSPITAL_COMMUNITY): Payer: Medicare PPO

## 2018-10-09 DIAGNOSIS — R29898 Other symptoms and signs involving the musculoskeletal system: Secondary | ICD-10-CM

## 2018-10-09 DIAGNOSIS — J189 Pneumonia, unspecified organism: Principal | ICD-10-CM

## 2018-10-09 DIAGNOSIS — R6510 Systemic inflammatory response syndrome (SIRS) of non-infectious origin without acute organ dysfunction: Secondary | ICD-10-CM | POA: Diagnosis present

## 2018-10-09 DIAGNOSIS — I10 Essential (primary) hypertension: Secondary | ICD-10-CM | POA: Diagnosis not present

## 2018-10-09 DIAGNOSIS — Z791 Long term (current) use of non-steroidal anti-inflammatories (NSAID): Secondary | ICD-10-CM | POA: Diagnosis not present

## 2018-10-09 DIAGNOSIS — R531 Weakness: Secondary | ICD-10-CM | POA: Diagnosis present

## 2018-10-09 DIAGNOSIS — Z66 Do not resuscitate: Secondary | ICD-10-CM | POA: Diagnosis present

## 2018-10-09 DIAGNOSIS — Y92009 Unspecified place in unspecified non-institutional (private) residence as the place of occurrence of the external cause: Secondary | ICD-10-CM | POA: Diagnosis not present

## 2018-10-09 DIAGNOSIS — Z7982 Long term (current) use of aspirin: Secondary | ICD-10-CM | POA: Diagnosis not present

## 2018-10-09 DIAGNOSIS — M4712 Other spondylosis with myelopathy, cervical region: Secondary | ICD-10-CM | POA: Diagnosis present

## 2018-10-09 DIAGNOSIS — M109 Gout, unspecified: Secondary | ICD-10-CM | POA: Diagnosis present

## 2018-10-09 DIAGNOSIS — F17210 Nicotine dependence, cigarettes, uncomplicated: Secondary | ICD-10-CM | POA: Diagnosis present

## 2018-10-09 DIAGNOSIS — E7800 Pure hypercholesterolemia, unspecified: Secondary | ICD-10-CM | POA: Diagnosis present

## 2018-10-09 DIAGNOSIS — Z79899 Other long term (current) drug therapy: Secondary | ICD-10-CM | POA: Diagnosis not present

## 2018-10-09 DIAGNOSIS — Z833 Family history of diabetes mellitus: Secondary | ICD-10-CM | POA: Diagnosis not present

## 2018-10-09 DIAGNOSIS — R296 Repeated falls: Secondary | ICD-10-CM | POA: Diagnosis present

## 2018-10-09 DIAGNOSIS — I5032 Chronic diastolic (congestive) heart failure: Secondary | ICD-10-CM | POA: Diagnosis present

## 2018-10-09 DIAGNOSIS — M50221 Other cervical disc displacement at C4-C5 level: Secondary | ICD-10-CM | POA: Diagnosis present

## 2018-10-09 DIAGNOSIS — Z20828 Contact with and (suspected) exposure to other viral communicable diseases: Secondary | ICD-10-CM | POA: Diagnosis present

## 2018-10-09 DIAGNOSIS — Z96642 Presence of left artificial hip joint: Secondary | ICD-10-CM | POA: Diagnosis present

## 2018-10-09 DIAGNOSIS — Z7984 Long term (current) use of oral hypoglycemic drugs: Secondary | ICD-10-CM | POA: Diagnosis not present

## 2018-10-09 DIAGNOSIS — E119 Type 2 diabetes mellitus without complications: Secondary | ICD-10-CM | POA: Diagnosis present

## 2018-10-09 DIAGNOSIS — J181 Lobar pneumonia, unspecified organism: Secondary | ICD-10-CM | POA: Diagnosis not present

## 2018-10-09 DIAGNOSIS — Z8673 Personal history of transient ischemic attack (TIA), and cerebral infarction without residual deficits: Secondary | ICD-10-CM | POA: Diagnosis not present

## 2018-10-09 DIAGNOSIS — Z7289 Other problems related to lifestyle: Secondary | ICD-10-CM | POA: Diagnosis not present

## 2018-10-09 DIAGNOSIS — E785 Hyperlipidemia, unspecified: Secondary | ICD-10-CM | POA: Diagnosis present

## 2018-10-09 DIAGNOSIS — Z981 Arthrodesis status: Secondary | ICD-10-CM | POA: Diagnosis not present

## 2018-10-09 DIAGNOSIS — J440 Chronic obstructive pulmonary disease with acute lower respiratory infection: Secondary | ICD-10-CM | POA: Diagnosis present

## 2018-10-09 DIAGNOSIS — Z7401 Bed confinement status: Secondary | ICD-10-CM | POA: Diagnosis not present

## 2018-10-09 DIAGNOSIS — I110 Hypertensive heart disease with heart failure: Secondary | ICD-10-CM | POA: Diagnosis present

## 2018-10-09 DIAGNOSIS — W19XXXA Unspecified fall, initial encounter: Secondary | ICD-10-CM | POA: Diagnosis present

## 2018-10-09 LAB — BASIC METABOLIC PANEL
Anion gap: 7 (ref 5–15)
BUN: 7 mg/dL — ABNORMAL LOW (ref 8–23)
CO2: 23 mmol/L (ref 22–32)
Calcium: 9 mg/dL (ref 8.9–10.3)
Chloride: 110 mmol/L (ref 98–111)
Creatinine, Ser: 0.84 mg/dL (ref 0.61–1.24)
GFR calc Af Amer: >60 mL/min (ref >60)
GFR calc non Af Amer: >60 mL/min (ref >60)
Glucose, Bld: 133 mg/dL — ABNORMAL HIGH (ref 70–99)
Potassium: 3.8 mmol/L (ref 3.5–5.1)
Sodium: 140 mmol/L (ref 135–145)

## 2018-10-09 LAB — CBC
HCT: 41.2 % (ref 39.0–52.0)
Hemoglobin: 13.7 g/dL (ref 13.0–17.0)
MCH: 33.9 pg (ref 26.0–34.0)
MCHC: 33.3 g/dL (ref 30.0–36.0)
MCV: 102 fL — ABNORMAL HIGH (ref 80.0–100.0)
Platelets: 223 10*3/uL (ref 150–400)
RBC: 4.04 MIL/uL — ABNORMAL LOW (ref 4.22–5.81)
RDW: 13 % (ref 11.5–15.5)
WBC: 11.8 10*3/uL — ABNORMAL HIGH (ref 4.0–10.5)
nRBC: 0 % (ref 0.0–0.2)

## 2018-10-09 LAB — GLUCOSE, CAPILLARY
Glucose-Capillary: 81 mg/dL (ref 70–99)
Glucose-Capillary: 75 mg/dL (ref 70–99)
Glucose-Capillary: 94 mg/dL (ref 70–99)
Glucose-Capillary: 143 mg/dL — ABNORMAL HIGH (ref 70–99)

## 2018-10-09 LAB — VITAMIN B12: Vitamin B-12: 930 pg/mL — ABNORMAL HIGH (ref 180–914)

## 2018-10-09 LAB — SARS CORONAVIRUS 2 BY RT PCR (HOSPITAL ORDER, PERFORMED IN ~~LOC~~ HOSPITAL LAB): SARS Coronavirus 2: NEGATIVE

## 2018-10-09 LAB — TSH: TSH: 0.672 u[IU]/mL (ref 0.350–4.500)

## 2018-10-09 LAB — CK: Total CK: 107 U/L (ref 49–397)

## 2018-10-09 LAB — STREP PNEUMONIAE URINARY ANTIGEN: Strep Pneumo Urinary Antigen: NEGATIVE

## 2018-10-09 LAB — HIV ANTIBODY (ROUTINE TESTING W REFLEX): HIV Screen 4th Generation wRfx: NONREACTIVE

## 2018-10-09 MED ORDER — LISINOPRIL 10 MG PO TABS
10.0000 mg | ORAL_TABLET | Freq: Every day | ORAL | Status: DC
  Administered 2018-10-09: 10 mg via ORAL
  Filled 2018-10-09: qty 1

## 2018-10-09 MED ORDER — ENOXAPARIN SODIUM 40 MG/0.4ML ~~LOC~~ SOLN
40.0000 mg | Freq: Every day | SUBCUTANEOUS | Status: DC
  Administered 2018-10-09 – 2018-10-13 (×5): 40 mg via SUBCUTANEOUS
  Filled 2018-10-09 (×5): qty 0.4

## 2018-10-09 MED ORDER — ACETAMINOPHEN 325 MG PO TABS
650.0000 mg | ORAL_TABLET | Freq: Once | ORAL | Status: AC
  Administered 2018-10-09: 650 mg via ORAL
  Filled 2018-10-09: qty 2

## 2018-10-09 MED ORDER — INSULIN ASPART 100 UNIT/ML ~~LOC~~ SOLN
0.0000 [IU] | Freq: Three times a day (TID) | SUBCUTANEOUS | Status: DC
  Administered 2018-10-10 – 2018-10-11 (×2): 2 [IU] via SUBCUTANEOUS
  Administered 2018-10-12: 1 [IU] via SUBCUTANEOUS

## 2018-10-09 MED ORDER — ACETAMINOPHEN 325 MG PO TABS
650.0000 mg | ORAL_TABLET | Freq: Four times a day (QID) | ORAL | Status: DC | PRN
  Administered 2018-10-11: 650 mg via ORAL
  Filled 2018-10-09: qty 2

## 2018-10-09 MED ORDER — LORAZEPAM 2 MG/ML IJ SOLN
1.0000 mg | Freq: Four times a day (QID) | INTRAMUSCULAR | Status: AC | PRN
  Administered 2018-10-12: 1 mg via INTRAVENOUS
  Filled 2018-10-09: qty 1

## 2018-10-09 MED ORDER — ACETAMINOPHEN 650 MG RE SUPP: 650.0000 mg | Freq: Four times a day (QID) | RECTAL | Status: DC | PRN

## 2018-10-09 MED ORDER — GABAPENTIN 300 MG PO CAPS
300.0000 mg | ORAL_CAPSULE | Freq: Every day | ORAL | Status: DC
  Administered 2018-10-09 – 2018-10-13 (×5): 300 mg via ORAL
  Filled 2018-10-09 (×5): qty 1

## 2018-10-09 MED ORDER — GABAPENTIN 300 MG PO CAPS
600.0000 mg | ORAL_CAPSULE | Freq: Every day | ORAL | Status: DC
  Administered 2018-10-09 – 2018-10-12 (×4): 600 mg via ORAL
  Filled 2018-10-09 (×4): qty 2

## 2018-10-09 MED ORDER — LISINOPRIL 20 MG PO TABS
20.0000 mg | ORAL_TABLET | Freq: Every day | ORAL | Status: DC
  Administered 2018-10-10 – 2018-10-13 (×4): 20 mg via ORAL
  Filled 2018-10-09 (×4): qty 1

## 2018-10-09 MED ORDER — ONDANSETRON HCL 4 MG/2ML IJ SOLN: 4.0000 mg | Freq: Four times a day (QID) | INTRAMUSCULAR | Status: DC | PRN

## 2018-10-09 MED ORDER — FOLIC ACID 1 MG PO TABS
1.0000 mg | ORAL_TABLET | Freq: Every day | ORAL | Status: DC
  Administered 2018-10-09 – 2018-10-13 (×5): 1 mg via ORAL
  Filled 2018-10-09 (×5): qty 1

## 2018-10-09 MED ORDER — SODIUM CHLORIDE 0.9 % IV SOLN
500.0000 mg | INTRAVENOUS | Status: DC
  Administered 2018-10-09 – 2018-10-12 (×4): 500 mg via INTRAVENOUS
  Filled 2018-10-09 (×5): qty 500

## 2018-10-09 MED ORDER — LORAZEPAM 1 MG PO TABS: 1.0000 mg | ORAL_TABLET | Freq: Four times a day (QID) | ORAL | Status: AC | PRN

## 2018-10-09 MED ORDER — LACTATED RINGERS IV SOLN
INTRAVENOUS | Status: AC
  Administered 2018-10-09 (×2): via INTRAVENOUS

## 2018-10-09 MED ORDER — ONDANSETRON HCL 4 MG PO TABS: 4.0000 mg | ORAL_TABLET | Freq: Four times a day (QID) | ORAL | Status: DC | PRN

## 2018-10-09 MED ORDER — SODIUM CHLORIDE 0.9 % IV BOLUS (SEPSIS)
1000.0000 mL | Freq: Once | INTRAVENOUS | Status: AC
  Administered 2018-10-09: 02:00:00 1000 mL via INTRAVENOUS

## 2018-10-09 MED ORDER — SODIUM CHLORIDE 0.9 % IV BOLUS (SEPSIS)
1000.0000 mL | Freq: Once | INTRAVENOUS | Status: AC
  Administered 2018-10-09: 03:00:00 1000 mL via INTRAVENOUS

## 2018-10-09 MED ORDER — HYDRALAZINE HCL 20 MG/ML IJ SOLN
10.0000 mg | Freq: Three times a day (TID) | INTRAMUSCULAR | Status: DC | PRN
  Administered 2018-10-10: 10 mg via INTRAVENOUS
  Filled 2018-10-09: qty 1

## 2018-10-09 MED ORDER — ADULT MULTIVITAMIN W/MINERALS CH
1.0000 | ORAL_TABLET | Freq: Every day | ORAL | Status: DC
  Administered 2018-10-09 – 2018-10-13 (×5): 1 via ORAL
  Filled 2018-10-09 (×5): qty 1

## 2018-10-09 MED ORDER — ALLOPURINOL 100 MG PO TABS
200.0000 mg | ORAL_TABLET | Freq: Every day | ORAL | Status: DC
  Administered 2018-10-09 – 2018-10-13 (×5): 200 mg via ORAL
  Filled 2018-10-09 (×6): qty 2

## 2018-10-09 MED ORDER — SODIUM CHLORIDE 0.9 % IV BOLUS (SEPSIS)
1000.0000 mL | Freq: Once | INTRAVENOUS | Status: AC
  Administered 2018-10-09: 1000 mL via INTRAVENOUS

## 2018-10-09 MED ORDER — VITAMIN B-1 100 MG PO TABS
100.0000 mg | ORAL_TABLET | Freq: Every day | ORAL | Status: DC
  Administered 2018-10-09 – 2018-10-13 (×5): 100 mg via ORAL
  Filled 2018-10-09 (×5): qty 1

## 2018-10-09 MED ORDER — SODIUM CHLORIDE 0.9 % IV SOLN
2.0000 g | INTRAVENOUS | Status: DC
  Administered 2018-10-09 – 2018-10-12 (×5): 2 g via INTRAVENOUS
  Filled 2018-10-09: qty 2
  Filled 2018-10-09 (×4): qty 20
  Filled 2018-10-09: qty 2

## 2018-10-09 MED ORDER — PRAVASTATIN SODIUM 10 MG PO TABS
20.0000 mg | ORAL_TABLET | Freq: Every day | ORAL | Status: DC
  Administered 2018-10-09 – 2018-10-12 (×4): 20 mg via ORAL
  Filled 2018-10-09 (×4): qty 2

## 2018-10-09 NOTE — ED Notes (Signed)
Pt given water per MD  

## 2018-10-09 NOTE — ED Notes (Addendum)
Brought pt back to room, very unsteady on feet and could not stand up without help. Once in bed pt falls back into the bed and becomes very weak

## 2018-10-09 NOTE — Evaluation (Signed)
Occupational Therapy Evaluation Patient Details Name: Keith Wise MRN: [id number] DOB: [birth year] Today's Date: 10/09/2018    History of Present Illness 78 year old with past medical history of COPD with ongoing tobacco abuse, hypertension, previous history of seizure as a childhood and diabetes type 2 who was brought to the ER after patient had a fall at home.  Per family patient has been getting progressively weaker over the last 6 months has become partially bedbound.  He has some numbness in his upper extremity and had difficulty relating and PCP has sent him to physical therapy initially.  Patient has been having difficulty with ambulation since December.   Clinical Impression   Pt PTA: pt reports bedridden x3 months and not really walking since Dec. Pt relying mostly on spouse and becoming a burden. Pt currently limited by decreased motivation to perform mobility, decreased strength and increased need for assist for ADL; pt rolling side to side with modA and easier to turn to L. Pt with mobility in extremities, but poor coordination and strength. Pt requires increased time to hold cup to mouth at bed level. Pt requires increased assist for all functional tasks. Pt denying need for SNF, but pt realizes that he may be too much for spouse to care for. Pt would benefit from continued OT skilled services and hospital bed if pt does return home. OT following acutely. OT to focus on EOB ADL, numbness in hands.    Follow Up Recommendations  SNF;Supervision/Assistance - 24 hour    Equipment Recommendations  Hospital bed    Recommendations for Other Services       Precautions / Restrictions Precautions Precautions: Fall Restrictions Weight Bearing Restrictions: No      Mobility Bed Mobility Overal bed mobility: Needs Assistance Bed Mobility: Rolling Rolling: Mod assist         General bed mobility comments: mod A for rolling, adamant declining attempting to sit up  today  Transfers Overall transfer level: Needs assistance               General transfer comment: Unable to test as pt awaiting MRI and refusing OOB    Balance Overall balance assessment: Needs assistance;History of Falls                                         ADL either performed or assessed with clinical judgement   ADL Overall ADL's : Needs assistance/impaired Eating/Feeding: Set up;Bed level Eating/Feeding Details (indicate cue type and reason): requires assist for opening containers. Grooming: Minimal assistance;Bed level Grooming Details (indicate cue type and reason): assist as pt with increased weakness, Upper Body Bathing: Minimal assistance;Bed level   Lower Body Bathing: Total assistance;Bed level   Upper Body Dressing : Minimal assistance;Bed level   Lower Body Dressing: Total assistance;Bed level   Toilet Transfer: Total assistance   Toileting- Clothing Manipulation and Hygiene: Total assistance       Functional mobility during ADLs: Maximal assistance;+2 for physical assistance;+2 for safety/equipment(rolling in bed) General ADL Comments: Pt increased need for MinA for UB ADL and LB nearly totalA due to weakness      Vision Baseline Vision/History: No visual deficits Vision Assessment?: No apparent visual deficits     Perception     Praxis      Pertinent Vitals/Pain Pain Assessment: No/denies pain     Hand Dominance Right   Extremity/Trunk Assessment Upper Extremity Assessment  Upper Extremity Assessment: Generalized weakness   Lower Extremity Assessment Lower Extremity Assessment: Generalized weakness       Communication Communication Communication: No difficulties   Cognition Arousal/Alertness: Awake/alert Behavior During Therapy: Flat affect Overall Cognitive Status: No family/caregiver present to determine baseline cognitive functioning                                     General Comments  pt  reports that his spouse can continue to assist at home- refusing post acute rehab, but in PT eval note, pt agreeable to SNF. Pt encouraged to be open for SNF.    Exercises     Shoulder Instructions      Home Living Family/patient expects to be discharged to:: Private residence Living Arrangements: Spouse/significant other Available Help at Discharge: Family;Available 24 hours/day Type of Home: House Home Access: Level entry     Home Layout: One level     Bathroom Shower/Tub: Chief Strategy OfficerTub/shower unit   Bathroom Toilet: Standard Bathroom Accessibility: Yes How Accessible: Accessible via wheelchair Home Equipment: Tub bench;Walker - 2 wheels;Wheelchair - manual          Prior Functioning/Environment          Comments: bed bound at home for last several weeks, over last few months increasing debility. 3 months ago was walking indpendently he reports        OT Problem List: Decreased strength;Decreased activity tolerance;Impaired balance (sitting and/or standing);Decreased safety awareness;Decreased coordination;Pain      OT Treatment/Interventions: Self-care/ADL training;Therapeutic exercise;Neuromuscular education;Energy conservation;Therapeutic activities;Patient/family education;Balance training    OT Goals(Current goals can be found in the care plan section) Acute Rehab OT Goals Patient Stated Goal: get stronger OT Goal Formulation: With patient Time For Goal Achievement: 10/23/18 Potential to Achieve Goals: Good ADL Goals Pt Will Perform Grooming: with set-up;sitting Pt Will Perform Upper Body Dressing: with set-up;sitting Pt Will Perform Lower Body Dressing: with mod assist;sitting/lateral leans Pt Will Perform Toileting - Clothing Manipulation and hygiene: with mod assist;sitting/lateral leans Pt/caregiver will Perform Home Exercise Program: Both right and left upper extremity;With written HEP provided;Right Upper extremity;Left upper extremity Additional ADL Goal #1:  Pt will sit EOB for ADL x10 mins with fair balance in prep for ADL.  OT Frequency: Min 2X/week   Barriers to D/C: Decreased caregiver support  Pt's spouse is his primary caregiver who may be getting burnt out and unable to care for pt who is bedridden now.       Co-evaluation              AM-PAC OT "6 Clicks" Daily Activity     Outcome Measure Help from another person eating meals?: A Little Help from another person taking care of personal grooming?: A Little Help from another person toileting, which includes using toliet, bedpan, or urinal?: Total Help from another person bathing (including washing, rinsing, drying)?: Total Help from another person to put on and taking off regular upper body clothing?: A Lot Help from another person to put on and taking off regular lower body clothing?: Total 6 Click Score: 11   End of Session Nurse Communication: Mobility status  Activity Tolerance: Patient limited by pain Patient left: in bed;with call bell/phone within reach;with bed alarm set  OT Visit Diagnosis: Unsteadiness on feet (R26.81);Muscle weakness (generalized) (M62.81)                Time: [id number] OT Time Calculation (min): 35  min Charges:  OT General Charges $OT Visit: 1 Visit OT Evaluation $OT Eval Moderate Complexity: 1 Mod OT Treatments $Self Care/Home Management : 8-22 mins  Ebony Hail Harold Hedge) Marsa Aris OTR/L Acute Rehabilitation Services Pager: [phone] Office: [place] 10/09/2018, 3:57 PM

## 2018-10-09 NOTE — ED Notes (Signed)
ED TO INPATIENT HANDOFF REPORT  ED Nurse Name and Phone #:  [phone]  S Name/Age/Gender Keith Wise 78 y.o. male Room/Bed: 026C/026C  Code Status   Code Status: Prior  Home/SNF/Other Home Patient oriented to: self, place and time Is this baseline? Yes   Triage Complete: Triage complete  Chief Complaint Weakness   Triage Note Patient arrived with EMS from home fell multiple times this week with progressing generalized weakness for the past several months , CBG= 233 , alert and orientedx4 , wife tel. [phone].   Allergies No Known Allergies  Level of Care/Admitting Diagnosis ED Disposition    ED Disposition Condition Comment   Admit  [facility] [[id number]]  Level of Care: Telemetry Medical [104]  I expect the patient will be discharged within 24 hours: No (not a candidate for 5C-Observation unit)  Covid Evaluation: N/A  Diagnosis: SIRS (systemic inflammatory response syndrome) [facility](HCC) [[id number]][[id number]]  Admitting Physician: Eduard ClosKAKRAKANDY, ARSHAD N [phone][[id number]]  Attending Physician: Eduard ClosKAKRAKANDY, ARSHAD N Florian.Pax[[id number]]  PT Class (Do Not Modify): Observation [104]  PT Acc Code (Do Not Modify): Observation [10022]       B Medical/Surgery History Past Medical History:  Diagnosis Date  . COPD (chronic obstructive pulmonary disease) (HCC)   . Diabetes mellitus   . Hypercholesteremia   . Hypertension   . Seizures (HCC)    last seizure in the 80's   . Shortness of breath    Past Surgical History:  Procedure Laterality Date  . BACK SURGERY    . CERVICAL DISC SURGERY    . HEMIARTHROPLASTY HIP Left 11/01/2013   dr Eulah Pontmurphy  . HIP ARTHROPLASTY Left 11/01/2013   Procedure: Left hip  Hemiarthroplasty;  Surgeon: Sheral Apleyimothy D Murphy, MD;  [facility];  Service: Orthopedics;  Laterality: Left;  . ORTHOPEDIC SURGERY     lumbar, cervical     A IV Location/Drains/Wounds Patient Lines/Drains/Airways Status   Active Line/Drains/Airways    Name:   Placement date:    Placement time:   Site:   Days:   Peripheral IV 10/09/18 Left;Lateral Wrist   10/09/18    0213    Wrist   less than 1   Peripheral IV 10/09/18 Left;Posterior Forearm   10/09/18    0213    Forearm   less than 1   External Urinary Catheter   10/09/18    0226    -   less than 1   Incision (Closed) 11/01/13 Hip Left   11/01/13    1157     1803          Intake/Output Last 24 hours  Intake/Output Summary (Last 24 hours) at 10/09/2018 0435 Last data filed at 10/09/2018 0358 Gross per 24 hour  Intake 3300 ml  Output -  Net 3300 ml    Labs/Imaging Results for orders placed or performed during the hospital encounter of 10/08/18 (from the past 48 hour(s))  CBC with Differential     Status: Abnormal   Collection Time: 10/08/18  9:01 PM  Result Value Ref Range   WBC 18.7 (H) 4.0 - 10.5 K/uL   RBC 4.52 4.22 - 5.81 MIL/uL   Hemoglobin 15.4 13.0 - 17.0 g/dL   HCT 69.645.7 29.539.0 - 28.452.0 %   MCV 101.1 (H) 80.0 - 100.0 fL   MCH 34.1 (H) 26.0 - 34.0 pg   MCHC 33.7 30.0 - 36.0 g/dL   RDW 13.212.8 44.011.5 - 10.215.5 %   Platelets 265 150 - 400  K/uL   nRBC 0.0 0.0 - 0.2 %   Neutrophils Relative % 86 %   Neutro Abs 16.0 (H) 1.7 - 7.7 K/uL   Lymphocytes Relative 5 %   Lymphs Abs 1.0 0.7 - 4.0 K/uL   Monocytes Relative 8 %   Monocytes Absolute 1.4 (H) 0.1 - 1.0 K/uL   Eosinophils Relative 0 %   Eosinophils Absolute 0.1 0.0 - 0.5 K/uL   Basophils Relative 0 %   Basophils Absolute 0.1 0.0 - 0.1 K/uL   Immature Granulocytes 1 %   Abs Immature Granulocytes 0.15 (H) 0.00 - 0.07 K/uL    Comment: Performed at [facility] [address], [place], [place] [address]  Comprehensive metabolic panel     Status: Abnormal   Collection Time: 10/08/18  9:01 PM  Result Value Ref Range   Sodium 133 (L) 135 - 145 mmol/L   Potassium 4.2 3.5 - 5.1 mmol/L   Chloride 101 98 - 111 mmol/L   CO2 21 (L) 22 - 32 mmol/L   Glucose, Bld 167 (H) 70 - 99 mg/dL   BUN 12 8 - 23 mg/dL   Creatinine, Ser 1.06 0.61 - 1.24 mg/dL    Calcium 9.9 8.9 - 10.3 mg/dL   Total Protein 6.3 (L) 6.5 - 8.1 g/dL   Albumin 3.6 3.5 - 5.0 g/dL   AST 17 15 - 41 U/L   ALT 12 0 - 44 U/L   Alkaline Phosphatase 68 38 - 126 U/L   Total Bilirubin 0.6 0.3 - 1.2 mg/dL   GFR calc non Af Amer >60 >60 mL/min   GFR calc Af Amer >60 >60 mL/min   Anion gap 11 5 - 15    Comment: Performed at [facility], [place] [address], [place], [place] [address]  Lactic acid, plasma     Status: None   Collection Time: 10/08/18  9:01 PM  Result Value Ref Range   Lactic Acid, Venous 1.6 0.5 - 1.9 mmol/L    Comment: Performed at [facility] [address], [place], [place] [address]  CBG monitoring, ED     Status: Abnormal   Collection Time: 10/08/18  9:03 PM  Result Value Ref Range   Glucose-Capillary 156 (H) 70 - 99 mg/dL  Urinalysis, Routine w reflex microscopic     Status: None   Collection Time: 10/08/18  9:05 PM  Result Value Ref Range   Color, Urine YELLOW YELLOW   APPearance CLEAR CLEAR   Specific Gravity, Urine 1.014 1.005 - 1.030   pH 5.0 5.0 - 8.0   Glucose, UA NEGATIVE NEGATIVE mg/dL   Hgb urine dipstick NEGATIVE NEGATIVE   Bilirubin Urine NEGATIVE NEGATIVE   Ketones, ur NEGATIVE NEGATIVE mg/dL   Protein, ur NEGATIVE NEGATIVE mg/dL   Nitrite NEGATIVE NEGATIVE   Leukocytes,Ua NEGATIVE NEGATIVE    Comment: Performed at [facility] [address], [place],  [address]  SARS Coronavirus 2 (CEPHEID- Performed in [place] hospital lab), Hosp Order     Status: None   Collection Time: 10/09/18  2:14 AM   Specimen: Nasopharyngeal Swab  Result Value Ref Range   SARS Coronavirus 2 NEGATIVE NEGATIVE    Comment: (NOTE) If result is NEGATIVE SARS-CoV-2 target nucleic acids are NOT DETECTED. The SARS-CoV-2 RNA is generally detectable in upper and lower  respiratory specimens during the acute phase of infection. The lowest  concentration of SARS-CoV-2 viral copies this assay can detect is 250  copies /  mL. A negative  result does not preclude SARS-CoV-2 infection  and should not be used as the sole basis for treatment or other  patient management decisions.  A negative result may occur with  improper specimen collection / handling, submission of specimen other  than nasopharyngeal swab, presence of viral mutation(s) within the  areas targeted by this assay, and inadequate number of viral copies  (<250 copies / mL). A negative result must be combined with clinical  observations, patient history, and epidemiological information. If result is POSITIVE SARS-CoV-2 target nucleic acids are DETECTED. The SARS-CoV-2 RNA is generally detectable in upper and lower  respiratory specimens dur ing the acute phase of infection.  Positive  results are indicative of active infection with SARS-CoV-2.  Clinical  correlation with patient history and other diagnostic information is  necessary to determine patient infection status.  Positive results do  not rule out bacterial infection or co-infection with other viruses. If result is PRESUMPTIVE POSTIVE SARS-CoV-2 nucleic acids MAY BE PRESENT.   A presumptive positive result was obtained on the submitted specimen  and confirmed on repeat testing.  While 2019 novel coronavirus  (SARS-CoV-2) nucleic acids may be present in the submitted sample  additional confirmatory testing may be necessary for epidemiological  and / or clinical management purposes  to differentiate between  SARS-CoV-2 and other Sarbecovirus currently known to infect humans.  If clinically indicated additional testing with an alternate test  methodology [phone]([id number]) is advised. The SARS-CoV-2 RNA is generally  detectable in upper and lower respiratory sp ecimens during the acute  phase of infection. The expected result is Negative. Fact Sheet for Patients:  BoilerBrush.com.cyhttps://www.fda.gov/media/136312/download Fact Sheet for Healthcare Providers: https://pope.com/https://www.fda.gov/media/136313/download This test is not yet  approved or cleared by the [place] FDA and has been authorized for detection and/or diagnosis of SARS-CoV-2 by FDA under an Emergency Use Authorization (EUA).  This EUA will remain in effect (meaning this test can be used) for the duration of the COVID-19 declaration under Section 564(b)(1) of the Act, 21 U.S.C. section 360bbb-3(b)(1), unless the authorization is terminated or revoked sooner. Performed at [facility], [address] [place], [place] [address]    Dg Chest Port 1 View  Result Date: 10/09/2018 CLINICAL DATA:  Cough EXAM: PORTABLE CHEST 1 VIEW COMPARISON:  10/31/2013 FINDINGS: Postsurgical changes in the cervical spine. Right lung is clear. Airspace disease at the left base. Normal heart size. Aortic atherosclerosis. No pneumothorax. IMPRESSION: Patchy airspace disease at the left base, suspect for pneumonia. Electronically Signed   By: Jasmine PangKim  Fujinaga M.D.   On: 10/09/2018 01:27    Pending Labs Unresulted Labs (From admission, onward)    Start     Ordered   10/09/18 0141  Blood Culture (routine x 2)  BLOOD CULTURE X 2,   STAT     10/09/18 0141          Vitals/Pain Today's Vitals   10/09/18 0330 10/09/18 0357 10/09/18 0400 10/09/18 0430  BP: (!) 146/78  (!) 142/77 (!) 152/83  Pulse: 83  82 73  Resp: (!) 24     Temp: 98.9 F (37.2 C)     TempSrc: Oral     SpO2: 95%  95% 93%  PainSc:  0-No pain      Isolation Precautions No active isolations  Medications Medications  cefTRIAXone (ROCEPHIN) 2 g in sodium chloride 0.9 % 100 mL IVPB (0 g Intravenous Stopped 10/09/18 0246)  azithromycin (ZITHROMAX) 500 mg in sodium chloride 0.9 %  250 mL IVPB (0 mg Intravenous Stopped 10/09/18 0358)  acetaminophen (TYLENOL) tablet 650 mg (650 mg Oral Given 10/09/18 0215)  sodium chloride 0.9 % bolus 1,000 mL (0 mLs Intravenous Stopped 10/09/18 0255)    And  sodium chloride 0.9 % bolus 1,000 mL (0 mLs Intravenous Stopped 10/09/18 0255)    And  sodium chloride 0.9 % bolus  1,000 mL (0 mLs Intravenous Stopped 10/09/18 0326)    Mobility non-ambulatory High fall risk   Focused Assessments Pulmonary Assessment Handoff:  Lung sounds: Bilateral Breath Sounds: Diminished O2 Device: Room Air        R Recommendations: See Admitting Provider Note  Report given to:   Additional Notes:  covid negative

## 2018-10-09 NOTE — NC FL2 (Signed)
[place] LEVEL OF CARE SCREENING TOOL     IDENTIFICATION  Patient Name: Keith Wise Birthdate: February 23, 1941 Sex: male Admission Date (Current Location): 10/08/2018  [facility] [place] Number:  Herbalist and Address:  [facility]. [facility], [place] [address], [place], [place] [address]      Provider Number: [id number]  Attending Physician Name and Address:  Elmarie Shiley, MD  Relative Name and Phone Number:  Tevion Laforge [phone]    Current Level of Care: Hospital Recommended Level of Care: [place] Prior Approval Number:    Date Approved/Denied: 11/02/13 PASRR Number: [id number]  Discharge Plan: SNF    Current Diagnoses: Patient Active Problem List   Diagnosis Date Noted  . SIRS (systemic inflammatory response syndrome) ([place]) 10/09/2018  . Lower extremity weakness 10/09/2018  . Community acquired pneumonia of left lower lobe of lung ([place]) 10/09/2018  . Type II or unspecified type diabetes mellitus without mention of complication, uncontrolled 11/05/2013  . Gout 11/05/2013  . Obesity (BMI 30-39.9) 11/03/2013  . COPD (chronic obstructive pulmonary disease) with chronic bronchitis ([place]) 11/02/2013  . Chronic diastolic congestive heart failure ([place]) 11/02/2013  . Leukocytosis, unspecified 11/02/2013  . Acute blood loss anemia 11/02/2013  . Left displaced femoral neck fracture ([place]) 10/31/2013  . Seizure disorder ([place]) 10/31/2013  . Unspecified disorder of thyroid 04/30/2011  . Tobacco abuse 04/15/2011  . Diabetes mellitus ([place]) 04/15/2011  . Hypertension 04/15/2011  . Hyperlipidemia 04/15/2011    Orientation RESPIRATION BLADDER Height & Weight     Self, Place  Normal Continent Weight: 76 kg Height:  5\' 9"  (175.3 cm)  BEHAVIORAL SYMPTOMS/MOOD NEUROLOGICAL BOWEL NUTRITION STATUS      Continent Diet(heart healthy, carb modified, thin liquid consistency)  AMBULATORY STATUS COMMUNICATION OF NEEDS Skin    Extensive Assist(mostly bed bound) Verbally Normal                       Personal Care Assistance Level of Assistance  Bathing, Feeding, Dressing Bathing Assistance: Limited assistance Feeding assistance: Limited assistance Dressing Assistance: Limited assistance     Functional Limitations Info  Sight, Hearing, Speech Sight Info: Adequate Hearing Info: Adequate Speech Info: Adequate    SPECIAL CARE FACTORS FREQUENCY  PT (By licensed PT), OT (By licensed OT)     PT Frequency: 5x weekly OT Frequency: 5x weejkt            Contractures Contractures Info: Not present    Additional Factors Info  Code Status, Allergies(no known allergies) Code Status Info: DNR Allergies Info: No known allergies           Current Medications (10/09/2018):  This is the current hospital active medication list Current Facility-Administered Medications  Medication Dose Route Frequency Provider Last Rate Last Dose  . acetaminophen (TYLENOL) tablet 650 mg  650 mg Oral Q6H PRN Rise Patience, MD       Or  . acetaminophen (TYLENOL) suppository 650 mg  650 mg Rectal Q6H PRN Rise Patience, MD      . allopurinol (ZYLOPRIM) tablet 200 mg  200 mg Oral Daily Rise Patience, MD   200 mg at 10/09/18 0855  . azithromycin (ZITHROMAX) 500 mg in sodium chloride 0.9 % 250 mL IVPB  500 mg Intravenous Q24H Rise Patience, MD   Stopped at 10/09/18 0358  . cefTRIAXone (ROCEPHIN) 2 g in sodium chloride 0.9 % 100 mL IVPB  2 g Intravenous  Q24H Eduard ClosKakrakandy, Arshad N, MD   Stopped at 10/09/18 0246  . enoxaparin (LOVENOX) injection 40 mg  40 mg Subcutaneous Daily Regalado, Belkys A, MD   40 mg at 10/09/18 1457  . folic acid (FOLVITE) tablet 1 mg  1 mg Oral Daily Regalado, Belkys A, MD   1 mg at 10/09/18 1457  . gabapentin (NEURONTIN) capsule 300 mg  300 mg Oral Daily Eduard ClosKakrakandy, Arshad N, MD   300 mg at 10/09/18 0855  . gabapentin (NEURONTIN) capsule 600 mg  600 mg Oral QHS Eduard ClosKakrakandy, Arshad  N, MD      . hydrALAZINE (APRESOLINE) injection 10 mg  10 mg Intravenous Q8H PRN Regalado, Belkys A, MD      . insulin aspart (novoLOG) injection 0-9 Units  0-9 Units Subcutaneous TID WC Eduard ClosKakrakandy, Arshad N, MD      . lactated ringers infusion   Intravenous Continuous Eduard ClosKakrakandy, Arshad N, MD 75 mL/hr at 10/09/18 0602    . [START ON 10/10/2018] lisinopril (ZESTRIL) tablet 20 mg  20 mg Oral Daily Regalado, Belkys A, MD      . LORazepam (ATIVAN) tablet 1 mg  1 mg Oral Q6H PRN Regalado, Belkys A, MD       Or  . LORazepam (ATIVAN) injection 1 mg  1 mg Intravenous Q6H PRN Regalado, Belkys A, MD      . multivitamin with minerals tablet 1 tablet  1 tablet Oral Daily Regalado, Belkys A, MD   1 tablet at 10/09/18 1457  . ondansetron (ZOFRAN) tablet 4 mg  4 mg Oral Q6H PRN Eduard ClosKakrakandy, Arshad N, MD       Or  . ondansetron [facility](ZOFRAN) injection 4 mg  4 mg Intravenous Q6H PRN Eduard ClosKakrakandy, Arshad N, MD      . pravastatin (PRAVACHOL) tablet 20 mg  20 mg Oral q1800 Eduard ClosKakrakandy, Arshad N, MD      . thiamine (VITAMIN B-1) tablet 100 mg  100 mg Oral Daily Eduard ClosKakrakandy, Arshad N, MD   100 mg at 10/09/18 [id number]     Discharge Medications: Please see discharge summary for a list of discharge medications.  Relevant Imaging Results:  Relevant Lab Results:   Additional Information [phone] [id number]  Leone Havenaylor, Marina Boerner Clinton, RN

## 2018-10-09 NOTE — Progress Notes (Addendum)
PROGRESS NOTE    Keith Wise  PJA:[id number] DOB: 11-12-1940 DOA: 10/08/2018 PCP: Leonard Downing, MD   Brief Narrative: 78 year old with past medical history of COPD with ongoing tobacco abuse, hypertension, previous history of seizure as a childhood and diabetes type 2 who was brought to the ER after patient had a fall at home.  Per family patient has been getting progressively weaker over the last 6 months has become partially bedbound.  He has some numbness in his upper extremity and had difficulty relating and PCP has sent him to physical therapy initially.  Patient has been having difficulty with ambulation since December.  Evaluation in the ED patient was found to be tachycardic temperature 100, leukocytosis, chest x-ray showing infiltrates concerning for pneumonia. COVID  test negative.  Assessment & Plan:   Principal Problem:   SIRS (systemic inflammatory response syndrome) (HCC) Active Problems:   Hypertension   Hyperlipidemia   COPD (chronic obstructive pulmonary disease) with chronic bronchitis (HCC)   Gout   Lower extremity weakness   Community acquired pneumonia of left lower lobe of lung ([place])  1-Community-acquired pneumonia, SIRS; Patient presents with fever, chest x-ray with infiltrates, leukocytosis. COVID  test negative. Continue with IV antibiotics Follow blood cultures Strep pneumonia antigen negative  2-frequent fall, progressive weakness over the last 6 months: CT head and C-spine ; no acute intracranial or cervical spine finding.  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 Degenerative disease which could cause mild cord impingement at C1-2 and C2-3. Check B12 normal at 930, CK level 107, TSH 0.6 Discussed with neurology will get MRI brain and MRI cervical spine with and without contrast. Further discussed results with neurology.   Hypertension: Continue lisinopril.  Add PRN hydralazine Diabetes type 2: Continue with sliding scale  insulin History of gout: Continue with allopurinol COPD stable Hyperlipidemia continue with statins Tobacco abuse advised quitting Alcohol use monitor on CIWA protocol.  On thiamine    Estimated body mass index is 24.74 kg/m as calculated from the following:   Height as of this encounter: 5\' 9"  (1.753 m).   Weight as of this encounter: 76 kg.   DVT prophylaxis: Lovenox Code Status: DNR Family Communication: Disposition Plan: In the hospital for IV antibiotics, PT eval Consultants:     Procedures:   None  Antimicrobials:  Ceftriaxone and azithromycin  Subjective: Reports that lower extremity weakness worse on the left more than 6 months. Reports productive cough and shortness of breath  Objective: Vitals:   10/09/18 0400 10/09/18 0430 10/09/18 0530 10/09/18 0815  BP: (!) 142/77 (!) 152/83 (!) 144/83 (!) 168/83  Pulse: 82 73 65 66  Resp:   20 17  Temp:   97.7 F (36.5 C) (!) 97.5 F (36.4 C)  TempSrc:   Oral Oral  SpO2: 95% 93% 97% 96%  Weight:   76 kg   Height:   5\' 9"  (1.753 m)     Intake/Output Summary (Last 24 hours) at 10/09/2018 1317 Last data filed at 10/09/2018 0700 Gross per 24 hour  Intake 3300 ml  Output 850 ml  Net 2450 ml   Filed Weights   10/09/18 0530  Weight: 76 kg    Examination:  General exam: Appears calm and comfortable  Respiratory system: Bilateral rhonchorous Cardiovascular system: S1 & S2 heard, RRR. No JVD, murmurs, rubs, gallops or clicks. No pedal edema. Gastrointestinal system: Abdomen is nondistended, soft and nontender. No organomegaly or masses felt. Normal bowel sounds heard. Central nervous system: Alert and  oriented.  Bilateral lower extremity weakness worse on the left Extremities: Symmetric 5 x 5 power. Skin: No rashes, lesions or ulcers Psychiatry: Judgement and insight appear normal. Mood & affect appropriate.     Data Reviewed: I have personally reviewed following labs and imaging studies  CBC: Recent Labs   Lab 10/08/18 2101 10/09/18 1025  WBC 18.7* 11.8*  NEUTROABS 16.0*  --   HGB 15.4 13.7  HCT 45.7 41.2  MCV 101.1* 102.0*  PLT 265 223   Basic Metabolic Panel: Recent Labs  Lab 10/08/18 2101 10/09/18 1025  NA 133* 140  K 4.2 3.8  CL 101 110  CO2 21* 23  GLUCOSE 167* 133*  BUN 12 7*  CREATININE 1.06 0.84  CALCIUM 9.9 9.0   GFR: Estimated Creatinine Clearance: 74.8 mL/min (by C-G formula based on SCr of 0.84 mg/dL). Liver Function Tests: Recent Labs  Lab 10/08/18 2101  AST 17  ALT 12  ALKPHOS 68  BILITOT 0.6  PROT 6.3*  ALBUMIN 3.6   No results for input(s): LIPASE, AMYLASE in the last 168 hours. No results for input(s): AMMONIA in the last 168 hours. Coagulation Profile: No results for input(s): INR, PROTIME in the last 168 hours. Cardiac Enzymes: Recent Labs  Lab 10/09/18 1025  CKTOTAL 107   BNP (last 3 results) No results for input(s): PROBNP in the last 8760 hours. HbA1C: No results for input(s): HGBA1C in the last 72 hours. CBG: Recent Labs  Lab 10/08/18 2103 10/09/18 0816 10/09/18 1206  GLUCAP 156* 81 75   Lipid Profile: No results for input(s): CHOL, HDL, LDLCALC, TRIG, CHOLHDL, LDLDIRECT in the last 72 hours. Thyroid Function Tests: Recent Labs    10/09/18 1025  TSH 0.672   Anemia Panel: Recent Labs    10/09/18 1025  VITAMINB12 930*   Sepsis Labs: Recent Labs  Lab 10/08/18 2101  LATICACIDVEN 1.6    Recent Results (from the past 240 hour(s))  SARS Coronavirus 2 (CEPHEID- Performed in [facility] hospital lab), Hosp Order     Status: None   Collection Time: 10/09/18  2:14 AM   Specimen: Nasopharyngeal Swab  Result Value Ref Range Status   SARS Coronavirus 2 NEGATIVE NEGATIVE Final    Comment: (NOTE) If result is NEGATIVE SARS-CoV-2 target nucleic acids are NOT DETECTED. The SARS-CoV-2 RNA is generally detectable in upper and lower  respiratory specimens during the acute phase of infection. The lowest  concentration of  SARS-CoV-2 viral copies this assay can detect is 250  copies / mL. A negative result does not preclude SARS-CoV-2 infection  and should not be used as the sole basis for treatment or other  patient management decisions.  A negative result may occur with  improper specimen collection / handling, submission of specimen other  than nasopharyngeal swab, presence of viral mutation(s) within the  areas targeted by this assay, and inadequate number of viral copies  (<250 copies / mL). A negative result must be combined with clinical  observations, patient history, and epidemiological information. If result is POSITIVE SARS-CoV-2 target nucleic acids are DETECTED. The SARS-CoV-2 RNA is generally detectable in upper and lower  respiratory specimens dur ing the acute phase of infection.  Positive  results are indicative of active infection with SARS-CoV-2.  Clinical  correlation with patient history and other diagnostic information is  necessary to determine patient infection status.  Positive results do  not rule out bacterial infection or co-infection with other viruses. If result is PRESUMPTIVE POSTIVE SARS-CoV-2 nucleic acids  MAY BE PRESENT.   A presumptive positive result was obtained on the submitted specimen  and confirmed on repeat testing.  While 2019 novel coronavirus  (SARS-CoV-2) nucleic acids may be present in the submitted sample  additional confirmatory testing may be necessary for epidemiological  and / or clinical management purposes  to differentiate between  SARS-CoV-2 and other Sarbecovirus currently known to infect humans.  If clinically indicated additional testing with an alternate test  methodology [phone] is advised. The SARS-CoV-2 RNA is generally  detectable in upper and lower respiratory sp ecimens during the acute  phase of infection. The expected result is Negative. Fact Sheet for Patients:  BoilerBrush.com.cyhttps://www.fda.gov/media/136312/download Fact Sheet for Healthcare  Providers: https://pope.com/https://www.fda.gov/media/136313/download This test is not yet approved or cleared by the [place] FDA and has been authorized for detection and/or diagnosis of SARS-CoV-2 by FDA under an Emergency Use Authorization (EUA).  This EUA will remain in effect (meaning this test can be used) for the duration of the COVID-19 declaration under Section 564(b)(1) of the Act, 21 U.S.C. section 360bbb-3(b)(1), unless the authorization is terminated or revoked sooner. Performed at [facility], [address] [place], [place] [address]          Radiology Studies: Ct Head Wo Contrast  Result Date: 10/09/2018 CLINICAL DATA:  Multiple falls with progressive weakness for several months EXAM: CT HEAD WITHOUT CONTRAST CT CERVICAL SPINE WITHOUT CONTRAST TECHNIQUE: Multidetector CT imaging of the head and cervical spine was performed following the standard protocol without intravenous contrast. Multiplanar CT image reconstructions of the cervical spine were also generated. COMPARISON:  Cervical radiography 01/18/2010 FINDINGS: CT HEAD FINDINGS Brain: No evidence of acute infarction, hemorrhage, hydrocephalus, extra-axial collection or mass lesion/mass effect. Extensive low-density in the cerebral white matter attributed to chronic small vessel ischemia. Small remote left occipital cortex infarct. Vascular: Atherosclerotic calcification. Skull: Negative for fracture or erosion. Sinuses/Orbits: Negative CT CERVICAL SPINE FINDINGS Alignment: Normal alignment Skull base and vertebrae: Negative for acute fracture. Soft tissues and spinal canal: No prevertebral fluid or swelling. No visible canal hematoma. Disc levels: Prominent ligamentous thickening and mineralization about the dens with possible mild mass effect on the cervicomedullary junction. No superimposed acute erosion in the dens. C2-3 advanced disc degeneration with biforaminal stenosis. There may also be cord impingement at this level.  C3-4 ACDF with solid arthrodesis. C4-5 advanced disc degeneration with spinal stenosis and biforaminal impingement C5-C7 ACDF with solid arthrodesis. Upper chest: Emphysema. IMPRESSION: 1. No acute intracranial or cervical spine finding. 2. 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 3. Degenerative disease which could cause mild cord impingement at C1-2 and C2-3. Electronically Signed   By: Marnee SpringJonathon  Watts M.D.   On: 10/09/2018 05:00   Ct Cervical Spine Wo Contrast  Result Date: 10/09/2018 CLINICAL DATA:  Multiple falls with progressive weakness for several months EXAM: CT HEAD WITHOUT CONTRAST CT CERVICAL SPINE WITHOUT CONTRAST TECHNIQUE: Multidetector CT imaging of the head and cervical spine was performed following the standard protocol without intravenous contrast. Multiplanar CT image reconstructions of the cervical spine were also generated. COMPARISON:  Cervical radiography 01/18/2010 FINDINGS: CT HEAD FINDINGS Brain: No evidence of acute infarction, hemorrhage, hydrocephalus, extra-axial collection or mass lesion/mass effect. Extensive low-density in the cerebral white matter attributed to chronic small vessel ischemia. Small remote left occipital cortex infarct. Vascular: Atherosclerotic calcification. Skull: Negative for fracture or erosion. Sinuses/Orbits: Negative CT CERVICAL SPINE FINDINGS Alignment: Normal alignment Skull base and vertebrae: Negative for acute fracture. Soft tissues and spinal canal:  No prevertebral fluid or swelling. No visible canal hematoma. Disc levels: Prominent ligamentous thickening and mineralization about the dens with possible mild mass effect on the cervicomedullary junction. No superimposed acute erosion in the dens. C2-3 advanced disc degeneration with biforaminal stenosis. There may also be cord impingement at this level. C3-4 ACDF with solid arthrodesis. C4-5 advanced disc degeneration with spinal stenosis and biforaminal impingement C5-C7 ACDF with  solid arthrodesis. Upper chest: Emphysema. IMPRESSION: 1. No acute intracranial or cervical spine finding. 2. 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 3. Degenerative disease which could cause mild cord impingement at C1-2 and C2-3. Electronically Signed   By: Marnee SpringJonathon  Watts M.D.   On: 10/09/2018 05:00   Dg Pelvis Portable  Result Date: 10/09/2018 CLINICAL DATA:  Pelvic pain and tenderness post fall last night. EXAM: PORTABLE PELVIS 1-2 VIEWS COMPARISON:  11/01/2013 FINDINGS: Diffuse decreased bone mineralization. Left hip arthroplasty intact and unchanged. Mild symmetric degenerative change of the hips. No acute fracture or dislocation. Mild degenerate change of the spine and sacroiliac joints. IMPRESSION: No acute findings. Mild degenerative change of the hips.  Stable left hip arthroplasty. Electronically Signed   By: Elberta Fortisaniel  Boyle M.D.   On: 10/09/2018 07:31   Dg Chest Port 1 View  Result Date: 10/09/2018 CLINICAL DATA:  Cough EXAM: PORTABLE CHEST 1 VIEW COMPARISON:  10/31/2013 FINDINGS: Postsurgical changes in the cervical spine. Right lung is clear. Airspace disease at the left base. Normal heart size. Aortic atherosclerosis. No pneumothorax. IMPRESSION: Patchy airspace disease at the left base, suspect for pneumonia. Electronically Signed   By: Jasmine PangKim  Fujinaga M.D.   On: 10/09/2018 01:27        Scheduled Meds: . allopurinol  200 mg Oral Daily  . enoxaparin (LOVENOX) injection  40 mg Subcutaneous Daily  . gabapentin  300 mg Oral Daily  . gabapentin  600 mg Oral QHS  . insulin aspart  0-9 Units Subcutaneous TID WC  . [START ON 10/10/2018] lisinopril  20 mg Oral Daily  . pravastatin  20 mg Oral q1800  . thiamine  100 mg Oral Daily   Continuous Infusions: . azithromycin Stopped (10/09/18 0358)  . cefTRIAXone (ROCEPHIN)  IV Stopped (10/09/18 0246)  . lactated ringers 75 mL/hr at 10/09/18 0602     LOS: 0 days    Time spent: 35 minutes.     Alba CoryBelkys A Para Cossey, MD  [facility] Pager [phone]  If 7PM-7AM, please contact night-coverage [email or web address] Password [email or web address] 10/09/2018, 1:17 PM

## 2018-10-09 NOTE — H&P (Signed)
History and Physical    PAO HAFFEY JOA:[id number] DOB: Feb 18, 1941 DOA: 10/08/2018  PCP: Leonard Downing, MD  Patient coming from: Home.  History obtained from patient's wife.  Patient is not a good historian.  Chief Complaint: Fall.  HPI: Keith Wise is a 78 y.o. male with history of COPD with ongoing tobacco abuse, hypertension previous history of seizure as a childhood and diabetes mellitus type 2 was brought to the ER after patient had a fall at home.  Patient's wife states that over the last 6 months patient has been progressively getting weaker and has become largely bedbound.  Has numbness of the upper extremities and has difficulty ambulating and PCP had sent physical therapy initially.  Patient does not have any incontinence of urine or bowel.  Patient finds it difficult to ambulate since December last Christmas.  Today while patient's wife was in the porch patient had a fall while trying to get out of the sofa in the living room.  Did not lose consciousness or hit his head.  Over the last 1 week patient has been having productive cough.  ED Course: In the ER patient was febrile with temperature of 100.9 F tachycardic with heart rate 118 EKG showing sinus tachycardia and blood work showed leukocytosis chest x-ray showing infiltrates concerning for pneumonia patient was started on antibiotics for pneumonia after blood cultures obtained.  On exam patient appears nonfocal but complains of numbness of upper extremities.  Patient admitted for SIRS secondary to developing pneumonia and also further work-up on his generalized weakness progressing over the last few months.  CT head C-spine was negative.  COVID-19 test was negative.  Patient was given 2 L normal saline bolus.  Review of Systems: As per HPI, rest all negative.   Past Medical History:  Diagnosis Date   COPD (chronic obstructive pulmonary disease) ([place])    Diabetes mellitus    Hypercholesteremia    Hypertension     Seizures ([place])    last seizure in the 80's    Shortness of breath     Past Surgical History:  Procedure Laterality Date   BACK SURGERY     CERVICAL DISC SURGERY     HEMIARTHROPLASTY HIP Left 11/01/2013   dr Percell Miller   HIP ARTHROPLASTY Left 11/01/2013   Procedure: Left hip  Hemiarthroplasty;  Surgeon: Renette Butters, MD;  Location: [place];  Service: Orthopedics;  Laterality: Left;   ORTHOPEDIC SURGERY     lumbar, cervical     reports that he has been smoking cigarettes. He has a 110.00 pack-year smoking history. He has never used smokeless tobacco. He reports that he does not drink alcohol or use drugs.  No Known Allergies  Family History  Problem Relation Age of Onset   Diabetes type II Other     Prior to Admission medications   Medication Sig Start Date End Date Taking? Authorizing Provider  allopurinol (ZYLOPRIM) 100 MG tablet TAKE 1 TABLET BY MOUTH EVERY DAY Patient taking differently: Take 100 mg by mouth daily.  12/23/13  Yes Blanchie Serve, MD  aspirin EC 325 MG tablet Take 1 tablet (325 mg total) by mouth daily. 11/01/13   Renette Butters, MD  docusate sodium (COLACE) 100 MG capsule Take 1 capsule (100 mg total) by mouth 2 (two) times daily. Continue this while taking narcotics to help with bowel movements 11/01/13   Renette Butters, MD  glimepiride (AMARYL) 2 MG tablet TAKE 1 TABLET BY MOUTH EVERY DAY  12/23/13   Oneal GroutPandey, Mahima, MD  ibuprofen (ADVIL,MOTRIN) 200 MG tablet Take 400 mg by mouth every morning.    [provider]  lisinopril (PRINIVIL,ZESTRIL) 20 MG tablet Take 20 mg by mouth daily.    [provider]  metFORMIN (GLUCOPHAGE) 1000 MG tablet Take 1,000 mg by mouth at bedtime.     [provider]  ondansetron (ZOFRAN) 4 MG tablet Take 1 tablet (4 mg total) by mouth every 8 (eight) hours as needed for nausea. 11/01/13   Sheral ApleyMurphy, Timothy D, MD  pravastatin (PRAVACHOL) 40 MG tablet Take 40 mg by mouth daily.    [provider]    colchicine 0.6 MG tablet Take 1 tablet (0.6 mg total) by mouth daily. For 5 days 11/04/13 10/09/18  Leatha GildingGherghe, Costin M, MD    Physical Exam: Constitutional: Moderately built and nourished. Vitals:   10/09/18 0300 10/09/18 0330 10/09/18 0400 10/09/18 0430  BP: 137/75 (!) 146/78 (!) 142/77 (!) 152/83  Pulse: 85 83 82 73  Resp: (!) 27 (!) 24    Temp:  98.9 F (37.2 C)    TempSrc:  Oral    SpO2: 96% 95% 95% 93%   Eyes: Anicteric no pallor. ENMT: No discharge from the ears eyes nose or mouth. Neck: No mass or.  No neck rigidity. Respiratory: No rhonchi or crepitations. Cardiovascular: S1-S2 heard. Abdomen: Soft nontender bowel sounds present. Musculoskeletal: No edema.  No joint effusion. Skin: No rash. Neurologic: Alert awake oriented to time place and person.  Moves all extremities.  Generally weak on exam. Psychiatric: Appears normal.   Labs on Admission: I have personally reviewed following labs and imaging studies  CBC: Recent Labs  Lab 10/08/18 2101  WBC 18.7*  NEUTROABS 16.0*  HGB 15.4  HCT 45.7  MCV 101.1*  PLT 265   Basic Metabolic Panel: Recent Labs  Lab 10/08/18 2101  NA 133*  K 4.2  CL 101  CO2 21*  GLUCOSE 167*  BUN 12  CREATININE 1.06  CALCIUM 9.9   GFR: CrCl cannot be calculated (Unknown ideal weight.). Liver Function Tests: Recent Labs  Lab 10/08/18 2101  AST 17  ALT 12  ALKPHOS 68  BILITOT 0.6  PROT 6.3*  ALBUMIN 3.6   No results for input(s): LIPASE, AMYLASE in the last 168 hours. No results for input(s): AMMONIA in the last 168 hours. Coagulation Profile: No results for input(s): INR, PROTIME in the last 168 hours. Cardiac Enzymes: No results for input(s): CKTOTAL, CKMB, CKMBINDEX, TROPONINI in the last 168 hours. BNP (last 3 results) No results for input(s): PROBNP in the last 8760 hours. HbA1C: No results for input(s): HGBA1C in the last 72 hours. CBG: Recent Labs  Lab 10/08/18 2103  GLUCAP 156*   Lipid Profile: No  results for input(s): CHOL, HDL, LDLCALC, TRIG, CHOLHDL, LDLDIRECT in the last 72 hours. Thyroid Function Tests: No results for input(s): TSH, T4TOTAL, FREET4, T3FREE, THYROIDAB in the last 72 hours. Anemia Panel: No results for input(s): VITAMINB12, FOLATE, FERRITIN, TIBC, IRON, RETICCTPCT in the last 72 hours. Urine analysis:    Component Value Date/Time   COLORURINE YELLOW 10/08/2018 2105   APPEARANCEUR CLEAR 10/08/2018 2105   LABSPEC 1.014 10/08/2018 2105   PHURINE 5.0 10/08/2018 2105   GLUCOSEU NEGATIVE 10/08/2018 2105   HGBUR NEGATIVE 10/08/2018 2105   BILIRUBINUR NEGATIVE 10/08/2018 2105   KETONESUR NEGATIVE 10/08/2018 2105   PROTEINUR NEGATIVE 10/08/2018 2105   UROBILINOGEN 1.0 11/01/2013 0023   NITRITE NEGATIVE 10/08/2018 2105   LEUKOCYTESUR NEGATIVE  10/08/2018 2105   Sepsis Labs: (procalcitonin:4,lacticidven:4) ) Recent Results (from the past 240 hour(s))  SARS Coronavirus 2 (CEPHEID- Performed in [facility] hospital lab), Hosp Order     Status: None   Collection Time: 10/09/18  2:14 AM   Specimen: Nasopharyngeal Swab  Result Value Ref Range Status   SARS Coronavirus 2 NEGATIVE NEGATIVE Final    Comment: (NOTE) If result is NEGATIVE SARS-CoV-2 target nucleic acids are NOT DETECTED. The SARS-CoV-2 RNA is generally detectable in upper and lower  respiratory specimens during the acute phase of infection. The lowest  concentration of SARS-CoV-2 viral copies this assay can detect is 250  copies / mL. A negative result does not preclude SARS-CoV-2 infection  and should not be used as the sole basis for treatment or other  patient management decisions.  A negative result may occur with  improper specimen collection / handling, submission of specimen other  than nasopharyngeal swab, presence of viral mutation(s) within the  areas targeted by this assay, and inadequate number of viral copies  (<250 copies / mL). A negative result must be combined with clinical    observations, patient history, and epidemiological information. If result is POSITIVE SARS-CoV-2 target nucleic acids are DETECTED. The SARS-CoV-2 RNA is generally detectable in upper and lower  respiratory specimens dur ing the acute phase of infection.  Positive  results are indicative of active infection with SARS-CoV-2.  Clinical  correlation with patient history and other diagnostic information is  necessary to determine patient infection status.  Positive results do  not rule out bacterial infection or co-infection with other viruses. If result is PRESUMPTIVE POSTIVE SARS-CoV-2 nucleic acids MAY BE PRESENT.   A presumptive positive result was obtained on the submitted specimen  and confirmed on repeat testing.  While 2019 novel coronavirus  (SARS-CoV-2) nucleic acids may be present in the submitted sample  additional confirmatory testing may be necessary for epidemiological  and / or clinical management purposes  to differentiate between  SARS-CoV-2 and other Sarbecovirus currently known to infect humans.  If clinically indicated additional testing with an alternate test  methodology [phone]) is advised. The SARS-CoV-2 RNA is generally  detectable in upper and lower respiratory sp ecimens during the acute  phase of infection. The expected result is Negative. Fact Sheet for Patients:  BoilerBrush.com.cy Fact Sheet for Healthcare Providers: https://pope.com/ This test is not yet approved or cleared by the [place] FDA and has been authorized for detection and/or diagnosis of SARS-CoV-2 by FDA under an Emergency Use Authorization (EUA).  This EUA will remain in effect (meaning this test can be used) for the duration of the COVID-19 declaration under Section 564(b)(1) of the Act, 21 U.S.C. section 360bbb-3(b)(1), unless the authorization is terminated or revoked sooner. Performed at [facility], [address],  [place], [place] [address]      Radiological Exams on Admission: Ct Head Wo Contrast  Result Date: 10/09/2018 CLINICAL DATA:  Multiple falls with progressive weakness for several months EXAM: CT HEAD WITHOUT CONTRAST CT CERVICAL SPINE WITHOUT CONTRAST TECHNIQUE: Multidetector CT imaging of the head and cervical spine was performed following the standard protocol without intravenous contrast. Multiplanar CT image reconstructions of the cervical spine were also generated. COMPARISON:  Cervical radiography 01/18/2010 FINDINGS: CT HEAD FINDINGS Brain: No evidence of acute infarction, hemorrhage, hydrocephalus, extra-axial collection or mass lesion/mass effect. Extensive low-density in the cerebral white matter attributed to chronic small vessel ischemia. Small remote left occipital cortex infarct. Vascular: Atherosclerotic calcification.  Skull: Negative for fracture or erosion. Sinuses/Orbits: Negative CT CERVICAL SPINE FINDINGS Alignment: Normal alignment Skull base and vertebrae: Negative for acute fracture. Soft tissues and spinal canal: No prevertebral fluid or swelling. No visible canal hematoma. Disc levels: Prominent ligamentous thickening and mineralization about the dens with possible mild mass effect on the cervicomedullary junction. No superimposed acute erosion in the dens. C2-3 advanced disc degeneration with biforaminal stenosis. There may also be cord impingement at this level. C3-4 ACDF with solid arthrodesis. C4-5 advanced disc degeneration with spinal stenosis and biforaminal impingement C5-C7 ACDF with solid arthrodesis. Upper chest: Emphysema. IMPRESSION: 1. No acute intracranial or cervical spine finding. 2. 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 3. Degenerative disease which could cause mild cord impingement at C1-2 and C2-3. Electronically Signed   By: Marnee SpringJonathon  Watts M.D.   On: 10/09/2018 05:00   Ct Cervical Spine Wo Contrast  Result Date: 10/09/2018 CLINICAL DATA:   Multiple falls with progressive weakness for several months EXAM: CT HEAD WITHOUT CONTRAST CT CERVICAL SPINE WITHOUT CONTRAST TECHNIQUE: Multidetector CT imaging of the head and cervical spine was performed following the standard protocol without intravenous contrast. Multiplanar CT image reconstructions of the cervical spine were also generated. COMPARISON:  Cervical radiography 01/18/2010 FINDINGS: CT HEAD FINDINGS Brain: No evidence of acute infarction, hemorrhage, hydrocephalus, extra-axial collection or mass lesion/mass effect. Extensive low-density in the cerebral white matter attributed to chronic small vessel ischemia. Small remote left occipital cortex infarct. Vascular: Atherosclerotic calcification. Skull: Negative for fracture or erosion. Sinuses/Orbits: Negative CT CERVICAL SPINE FINDINGS Alignment: Normal alignment Skull base and vertebrae: Negative for acute fracture. Soft tissues and spinal canal: No prevertebral fluid or swelling. No visible canal hematoma. Disc levels: Prominent ligamentous thickening and mineralization about the dens with possible mild mass effect on the cervicomedullary junction. No superimposed acute erosion in the dens. C2-3 advanced disc degeneration with biforaminal stenosis. There may also be cord impingement at this level. C3-4 ACDF with solid arthrodesis. C4-5 advanced disc degeneration with spinal stenosis and biforaminal impingement C5-C7 ACDF with solid arthrodesis. Upper chest: Emphysema. IMPRESSION: 1. No acute intracranial or cervical spine finding. 2. 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 3. Degenerative disease which could cause mild cord impingement at C1-2 and C2-3. Electronically Signed   By: Marnee SpringJonathon  Watts M.D.   On: 10/09/2018 05:00   Dg Chest Port 1 View  Result Date: 10/09/2018 CLINICAL DATA:  Cough EXAM: PORTABLE CHEST 1 VIEW COMPARISON:  10/31/2013 FINDINGS: Postsurgical changes in the cervical spine. Right lung is clear. Airspace  disease at the left base. Normal heart size. Aortic atherosclerosis. No pneumothorax. IMPRESSION: Patchy airspace disease at the left base, suspect for pneumonia. Electronically Signed   By: Jasmine PangKim  Fujinaga M.D.   On: 10/09/2018 01:27    EKG: Independently reviewed.  Sinus tachycardia.  Assessment/Plan Principal Problem:   SIRS (systemic inflammatory response syndrome) (HCC) Active Problems:   Hypertension   Hyperlipidemia   COPD (chronic obstructive pulmonary disease) with chronic bronchitis (HCC)   Gout   Lower extremity weakness   Community acquired pneumonia of left lower lobe of lung (HCC)    1. SIRS with possible developing sepsis secondary to pneumonia on empiric antibiotics follow cultures check urine for Legionella strep antigen IV fluids.  COVID-19 test was negative. 2. Progressive weakness over the last 6 months with difficulty ambulating may discuss with neurology in the morning.  CT head and C-spine were unremarkable. 3. Hypertension on lisinopril. 4. Diabetes mellitus type 2 we will keep  patient on sliding scale coverage. 5. History of gout on allopurinol. 6. Hyperlipidemia on statins. 7. COPD not actively wheezing. 8. Tobacco abuse -advised about quitting. 9. Patient drinks alcohol every other day usually 1 can of beer.  Closely monitor for any withdrawal.  On thiamine.   DVT prophylaxis: Lovenox. Code Status: DNR confirmed with patient's wife. Family Communication: Patient's wife. Disposition Plan: To be determined. Consults called: Physical therapy. Admission status: Observation   Eduard ClosArshad N Adekunle Rohrbach MD [facility] Pager [phone].  If 7PM-7AM, please contact night-coverage [email or web address] Password [email or web address]  10/09/2018, 5:25 AM

## 2018-10-09 NOTE — ED Provider Notes (Signed)
[facility] Provider Note   CSN: [id number] Arrival date & time: 10/08/18  2043     History   Chief Complaint Chief Complaint  Patient presents with  . Fall    Generalized Weakness    HPI Thomasene LotJoseph M Basilio is a 78 y.o. male.     The history is provided by the patient.  Fall This is a new problem. The problem has been gradually improving. Pertinent negatives include no chest pain and no abdominal pain. The symptoms are aggravated by walking. The symptoms are relieved by rest.  Patient with history of COPD and diabetes presents after fall.  He reports he has been feeling increased generalized weakness and fell at home.  Denies syncope.  No head injury.  He reports he had difficulty standing up after the fall, EMS was called. Reports chronic joint pain, but no acute pain issues or traumatic injury.  No new back pain.  No chest/abdominal pain. He does report recent cough as he is a smoker Reports previous history of seizure, but is no longer on antiepileptic therapy Past Medical History:  Diagnosis Date  . COPD (chronic obstructive pulmonary disease) (HCC)   . Diabetes mellitus   . Hypercholesteremia   . Hypertension   . Seizures (HCC)    last seizure in the 80's   . Shortness of breath     Patient Active Problem List   Diagnosis Date Noted  . Type II or unspecified type diabetes mellitus without mention of complication, uncontrolled 11/05/2013  . Gout 11/05/2013  . Obesity (BMI 30-39.9) 11/03/2013  . COPD (chronic obstructive pulmonary disease) with chronic bronchitis (HCC) 11/02/2013  . Chronic diastolic congestive heart failure (HCC) 11/02/2013  . Leukocytosis, unspecified 11/02/2013  . Acute blood loss anemia 11/02/2013  . Left displaced femoral neck fracture (HCC) 10/31/2013  . Seizure disorder (HCC) 10/31/2013  . Unspecified disorder of thyroid 04/30/2011  . Tobacco abuse 04/15/2011  . Diabetes mellitus (HCC) 04/15/2011  .  Hypertension 04/15/2011  . Hyperlipidemia 04/15/2011    Past Surgical History:  Procedure Laterality Date  . BACK SURGERY    . CERVICAL DISC SURGERY    . HEMIARTHROPLASTY HIP Left 11/01/2013   dr Eulah Pontmurphy  . HIP ARTHROPLASTY Left 11/01/2013   Procedure: Left hip  Hemiarthroplasty;  Surgeon: Sheral Apleyimothy D Murphy, MD;  [facility];  Service: Orthopedics;  Laterality: Left;  . ORTHOPEDIC SURGERY     lumbar, cervical        Home Medications    Prior to Admission medications   Medication Sig Start Date End Date Taking? Authorizing Provider  allopurinol (ZYLOPRIM) 100 MG tablet TAKE 1 TABLET BY MOUTH EVERY DAY Patient taking differently: Take 100 mg by mouth daily.  12/23/13  Yes Oneal GroutPandey, Mahima, MD  aspirin EC 325 MG tablet Take 1 tablet (325 mg total) by mouth daily. 11/01/13   Sheral ApleyMurphy, Timothy D, MD  docusate sodium (COLACE) 100 MG capsule Take 1 capsule (100 mg total) by mouth 2 (two) times daily. Continue this while taking narcotics to help with bowel movements 11/01/13   Sheral ApleyMurphy, Timothy D, MD  glimepiride (AMARYL) 2 MG tablet TAKE 1 TABLET BY MOUTH EVERY DAY 12/23/13   Oneal GroutPandey, Mahima, MD  ibuprofen (ADVIL,MOTRIN) 200 MG tablet Take 400 mg by mouth every morning.    [provider]  lisinopril (PRINIVIL,ZESTRIL) 20 MG tablet Take 20 mg by mouth daily.    [provider]  metFORMIN (GLUCOPHAGE) 1000 MG tablet Take 1,000 mg by  mouth at bedtime.     [provider]  methocarbamol (ROBAXIN) 500 MG tablet TAKE 1 TABLET BY MOUTH EVERY 6 HOURS AS NEEDED 03/01/14   Sharon SellerEubanks, Jessica K, NP  ondansetron (ZOFRAN) 4 MG tablet Take 1 tablet (4 mg total) by mouth every 8 (eight) hours as needed for nausea. 11/01/13   Sheral ApleyMurphy, Timothy D, MD  pravastatin (PRAVACHOL) 40 MG tablet Take 40 mg by mouth daily.    [provider]  colchicine 0.6 MG tablet Take 1 tablet (0.6 mg total) by mouth daily. For 5 days 11/04/13 10/09/18  Leatha GildingGherghe, Costin M, MD    Family History Family History   Problem Relation Age of Onset  . Diabetes type II Other     Social History Social History   Tobacco Use  . Smoking status: Current Every Day Smoker    Packs/day: 2.00    Years: 55.00    Pack years: 110.00    Types: Cigarettes  . Smokeless tobacco: Never Used  Substance Use Topics  . Alcohol use: No  . Drug use: No     Allergies   Patient has no known allergies.   Review of Systems Review of Systems  Constitutional: Positive for fatigue.  Respiratory: Positive for cough.   Cardiovascular: Negative for chest pain.  Gastrointestinal: Negative for abdominal pain.  Musculoskeletal: Positive for arthralgias. Negative for back pain.  Neurological: Positive for weakness.  All other systems reviewed and are negative.    Physical Exam Updated Vital Signs BP (!) 125/96   Pulse (!) 108   Temp (!) 100.9 F (38.3 C) (Rectal)   Resp (!) 22   SpO2 92%   Physical Exam CONSTITUTIONAL: Elderly and disheveled HEAD: Normocephalic/atraumatic, no signs of trauma EYES: EOMI/PERRL ENMT: Mucous membranes moist NECK: supple no meningeal signs SPINE/BACK:entire spine nontender CV: S1/S2 noted, no murmurs/rubs/gallops noted, tachycardic LUNGS: Mild tachypnea, coarse breath sounds bilaterally ABDOMEN: soft, nontender, no rebound or guarding, bowel sounds noted throughout abdomen GU:no cva tenderness NEURO: Pt is awake/alert/appropriate, moves all extremitiesx4.  No facial droop.  No focal weakness noted EXTREMITIES: pulses normal/equal, full ROM, pelvis stable, all other extremities/joints palpated/ranged and nontender SKIN: warm, color normal PSYCH: no abnormalities of mood noted, alert and oriented to situation   ED Treatments / Results  Labs (all labs ordered are listed, but only abnormal results are displayed) Labs Reviewed  CBC WITH DIFFERENTIAL/PLATELET - Abnormal; Notable for the following components:      Result Value   WBC 18.7 (*)    MCV 101.1 (*)    MCH 34.1 (*)     Neutro Abs 16.0 (*)    Monocytes Absolute 1.4 (*)    Abs Immature Granulocytes 0.15 (*)    All other components within normal limits  COMPREHENSIVE METABOLIC PANEL - Abnormal; Notable for the following components:   Sodium 133 (*)    CO2 21 (*)    Glucose, Bld 167 (*)    Total Protein 6.3 (*)    All other components within normal limits  CBG MONITORING, ED - Abnormal; Notable for the following components:   Glucose-Capillary 156 (*)    All other components within normal limits  SARS CORONAVIRUS 2 (HOSPITAL ORDER, PERFORMED IN [facility])  CULTURE, BLOOD (ROUTINE X 2)  CULTURE, BLOOD (ROUTINE X 2)  URINALYSIS, ROUTINE W REFLEX MICROSCOPIC  LACTIC ACID, PLASMA    EKG EKG Interpretation  Date/Time:  Wednesday October 08 2018 20:54:13 EDT Ventricular Rate:  121 PR Interval:  170  QRS Duration: 88 QT Interval:  334 QTC Calculation: 474 R Axis:   -80 Text Interpretation:  Sinus tachycardia with Premature atrial complexes Left anterior fascicular block Anterolateral infarct , age undetermined Abnormal ECG Interpretation limited secondary to artifact Confirmed by Ripley Fraise ([id number]) on 10/09/2018 12:48:07 AM   Radiology Dg Chest Port 1 View  Result Date: 10/09/2018 CLINICAL DATA:  Cough EXAM: PORTABLE CHEST 1 VIEW COMPARISON:  10/31/2013 FINDINGS: Postsurgical changes in the cervical spine. Right lung is clear. Airspace disease at the left base. Normal heart size. Aortic atherosclerosis. No pneumothorax. IMPRESSION: Patchy airspace disease at the left base, suspect for pneumonia. Electronically Signed   By: Donavan Foil M.D.   On: 10/09/2018 01:27    Procedures .Critical Care Performed by: Ripley Fraise, MD Authorized by: Ripley Fraise, MD   Critical care provider statement:    Critical care time (minutes):  35   Critical care start time:  10/09/2018 3:45 AM   Critical care end time:  10/09/2018 4:20 AM   Critical care was necessary to treat or prevent  imminent or life-threatening deterioration of the following conditions:  Respiratory failure and sepsis   Critical care was time spent personally by me on the following activities:  Discussions with consultants, examination of patient, re-evaluation of patient's condition, pulse oximetry, ordering and review of radiographic studies, ordering and review of laboratory studies, ordering and performing treatments and interventions and evaluation of patient's response to treatment      Medications Ordered in ED Medications  cefTRIAXone (ROCEPHIN) 2 g in sodium chloride 0.9 % 100 mL IVPB (0 g Intravenous Stopped 10/09/18 0246)  azithromycin (ZITHROMAX) 500 mg in sodium chloride 0.9 % 250 mL IVPB (0 mg Intravenous Stopped 10/09/18 0358)  acetaminophen (TYLENOL) tablet 650 mg (650 mg Oral Given 10/09/18 0215)  sodium chloride 0.9 % bolus 1,000 mL (0 mLs Intravenous Stopped 10/09/18 0255)    And  sodium chloride 0.9 % bolus 1,000 mL (0 mLs Intravenous Stopped 10/09/18 0255)    And  sodium chloride 0.9 % bolus 1,000 mL (0 mLs Intravenous Stopped 10/09/18 0326)     Initial Impression / Assessment and Plan / ED Course  I have reviewed the triage vital signs and the nursing notes.  Pertinent labs & imaging results that were available during my care of the patient were reviewed by me and considered in my medical decision making (see chart for details).        1:55 AM Patient presents after generalized weakness and fall.  No signs of traumatic injury Patient was found to be febrile and evidence of pneumonia.  He would need to be admitted. 1:59 AM Wife states pt has been falling for over a month There was concern for neck injury, but no obvious signs of neck trauma, no focal weakness noted.  He had previous cervical fusion. No new meds have been started for patient 4:24 AM D/w dr Hal Hope for admission Pt is COVID negative Vitals are improved He will need admission for pneumonia Generalized  weakness will also need re-evaluation once infection/PNA improved, he may need spinal imaging, but no new traumatic injuries, this can be followed upon as inpatient   ANGELINO RUMERY was evaluated in Emergency Department on 10/09/2018 for the symptoms described in the history of present illness. He was evaluated in the context of the global COVID-19 pandemic, which necessitated consideration that the patient might be at risk for infection with the SARS-CoV-2 virus that causes COVID-19. Institutional protocols and algorithms that  pertain to the evaluation of patients at risk for COVID-19 are in a state of rapid change based on information released by regulatory bodies including the CDC and federal and state organizations. These policies and algorithms were followed during the patient's care in the ED.  Final Clinical Impressions(s) / ED Diagnoses   Final diagnoses:  Community acquired pneumonia of left lower lobe of lung (HCC)  Weakness    ED Discharge Orders    None       Zadie RhineWickline, Edgel Degnan, MD 10/09/18 0425

## 2018-10-09 NOTE — ED Notes (Signed)
RN will get 2nd set of blood cultures.

## 2018-10-09 NOTE — ED Notes (Signed)
Pt. Was assisted w/ a 2 assist but unable to stay standing up to get standing ortho.v/s.pt. was experiencing SOB and very weak in legs.

## 2018-10-09 NOTE — Evaluation (Signed)
Physical Therapy Evaluation Patient Details Name: Keith Wise MRN: [id number] DOB: 27-Aug-1940 Today's Date: 10/09/2018   History of Present Illness  78 year old with past medical history of COPD with ongoing tobacco abuse, hypertension, previous history of seizure as a childhood and diabetes type 2 who was brought to the ER after patient had a fall at home.  Per family patient has been getting progressively weaker over the last 6 months has become partially bedbound.  He has some numbness in his upper extremity and had difficulty relating and PCP has sent him to physical therapy initially.  Patient has been having difficulty with ambulation since December.    Clinical Impression  Pt admitted with above diagnosis. Pt currently with functional limitations due to the deficits listed below (see PT Problem List). PTA, pt at home with progressive debility. Reports he walked 3 months ago independently but as of a few weeks ago has been mostly bed bound with wife taking care of him. Reports wife feels overburdened. Today, mod A for rolling, refusing sitting up with therapy. Agreeable to post acute rehab to regain mobility.  Pt will benefit from skilled PT to increase their independence and safety with mobility to allow discharge to the venue listed below.       Follow Up Recommendations SNF    Equipment Recommendations  None recommended by PT    Recommendations for Other Services       Precautions / Restrictions        Mobility  Bed Mobility Overal bed mobility: Needs Assistance Bed Mobility: Rolling Rolling: Mod assist         General bed mobility comments: mod A for rolling, adamant declining attempting to sit up today  Transfers                    Ambulation/Gait                Stairs            Wheelchair Mobility    Modified Rankin (Stroke Patients Only)       Balance                                             Pertinent  Vitals/Pain Pain Assessment: No/denies pain    Home Living Family/patient expects to be discharged to:: Private residence Living Arrangements: Spouse/significant other Available Help at Discharge: Family;Available 24 hours/day Type of Home: House Home Access: Level entry     Home Layout: One level Home Equipment: Tub bench;Walker - 2 wheels;Wheelchair - manual      Prior Function           Comments: bed bound at home for last several weeks, over last few months increasing debility. 3 months ago was walking indpendently he reports     Hand Dominance        Extremity/Trunk Assessment   Upper Extremity Assessment Upper Extremity Assessment: Generalized weakness    Lower Extremity Assessment Lower Extremity Assessment: Generalized weakness       Communication      Cognition Arousal/Alertness: Awake/alert Behavior During Therapy: Flat affect Overall Cognitive Status: No family/caregiver present to determine baseline cognitive functioning  General Comments      Exercises     Assessment/Plan    PT Assessment Patient needs continued PT services  PT Problem List Decreased strength       PT Treatment Interventions DME instruction;Gait training;Stair training;Functional mobility training;Therapeutic activities;Therapeutic exercise    PT Goals (Current goals can be found in the Care Plan section)  Acute Rehab PT Goals Patient Stated Goal: get stronger PT Goal Formulation: With patient Time For Goal Achievement: 10/23/18 Potential to Achieve Goals: Fair    Frequency Min 2X/week   Barriers to discharge        Co-evaluation               AM-PAC PT "6 Clicks" Mobility  Outcome Measure Help needed turning from your back to your side while in a flat bed without using bedrails?: Total Help needed moving from lying on your back to sitting on the side of a flat bed without using bedrails?:  Total Help needed moving to and from a bed to a chair (including a wheelchair)?: Total Help needed standing up from a chair using your arms (e.g., wheelchair or bedside chair)?: Total Help needed to walk in hospital room?: Total Help needed climbing 3-5 steps with a railing? : Total 6 Click Score: 6    End of Session   Activity Tolerance: Patient limited by fatigue Patient left: in bed   PT Visit Diagnosis: Unsteadiness on feet (R26.81)    Time: [id number] PT Time Calculation (min) (ACUTE ONLY): 23 min   Charges:   PT Evaluation $PT Eval Moderate Complexity: 1 Mod PT Treatments $Therapeutic Activity: 8-22 mins        Etta GrandchildSean Mckayla Wise, PT, DPT Acute Rehabilitation Services Pager: [phone] Office: [phone]    Etta GrandchildSean Maryana Wise 10/09/2018, 2:54 PM

## 2018-10-10 ENCOUNTER — Inpatient Hospital Stay (HOSPITAL_COMMUNITY): Payer: Medicare PPO

## 2018-10-10 DIAGNOSIS — I10 Essential (primary) hypertension: Secondary | ICD-10-CM

## 2018-10-10 LAB — GLUCOSE, CAPILLARY
Glucose-Capillary: 90 mg/dL (ref 70–99)
Glucose-Capillary: 180 mg/dL — ABNORMAL HIGH (ref 70–99)
Glucose-Capillary: 108 mg/dL — ABNORMAL HIGH (ref 70–99)
Glucose-Capillary: 168 mg/dL — ABNORMAL HIGH (ref 70–99)

## 2018-10-10 LAB — CBC
HCT: 42.1 % (ref 39.0–52.0)
Hemoglobin: 13.9 g/dL (ref 13.0–17.0)
MCH: 33.5 pg (ref 26.0–34.0)
MCHC: 33 g/dL (ref 30.0–36.0)
MCV: 101.4 fL — ABNORMAL HIGH (ref 80.0–100.0)
Platelets: 239 10*3/uL (ref 150–400)
RBC: 4.15 MIL/uL — ABNORMAL LOW (ref 4.22–5.81)
RDW: 12.8 % (ref 11.5–15.5)
WBC: 8.8 10*3/uL (ref 4.0–10.5)
nRBC: 0 % (ref 0.0–0.2)

## 2018-10-10 LAB — BASIC METABOLIC PANEL
Anion gap: 8 (ref 5–15)
BUN: 9 mg/dL (ref 8–23)
CO2: 24 mmol/L (ref 22–32)
Calcium: 9.9 mg/dL (ref 8.9–10.3)
Chloride: 108 mmol/L (ref 98–111)
Creatinine, Ser: 0.74 mg/dL (ref 0.61–1.24)
GFR calc Af Amer: >60 mL/min (ref >60)
GFR calc non Af Amer: >60 mL/min (ref >60)
Glucose, Bld: 81 mg/dL (ref 70–99)
Potassium: 3.8 mmol/L (ref 3.5–5.1)
Sodium: 140 mmol/L (ref 135–145)

## 2018-10-10 LAB — ECHOCARDIOGRAM COMPLETE
Height: 69 in
Weight: 2680.79 oz

## 2018-10-10 LAB — LEGIONELLA PNEUMOPHILA SEROGP 1 UR AG: L. pneumophila Serogp 1 Ur Ag: NEGATIVE

## 2018-10-10 LAB — PHOSPHORUS: Phosphorus: 2.8 mg/dL (ref 2.5–4.6)

## 2018-10-10 LAB — MAGNESIUM: Magnesium: 1.9 mg/dL (ref 1.7–2.4)

## 2018-10-10 LAB — RPR: RPR Ser Ql: NONREACTIVE

## 2018-10-10 MED ORDER — ASPIRIN EC 81 MG PO TBEC
81.0000 mg | DELAYED_RELEASE_TABLET | Freq: Every day | ORAL | Status: DC
  Administered 2018-10-10 – 2018-10-13 (×4): 81 mg via ORAL
  Filled 2018-10-10 (×4): qty 1

## 2018-10-10 MED ORDER — LORAZEPAM 2 MG/ML IJ SOLN: 0.5000 mg | Freq: Once | INTRAMUSCULAR | Status: DC

## 2018-10-10 MED ORDER — AMLODIPINE BESYLATE 5 MG PO TABS
5.0000 mg | ORAL_TABLET | Freq: Every day | ORAL | Status: DC
  Administered 2018-10-10 – 2018-10-13 (×4): 5 mg via ORAL
  Filled 2018-10-10 (×4): qty 1

## 2018-10-10 NOTE — TOC Initial Note (Addendum)
Transition of Care [facility]) - Initial/Assessment Note    Patient Details  Name: Keith Wise MRN: [id number] Date of Birth: 1940/08/08  [facility] CM/SW Contact:    Zenon Mayo, RN Phone Number: 10/10/2018, 10:35 AM  Clinical Narrative:                 From home with spouse, Patient gave NCM permission to contact wife, Steward Drone. Patient states he would [facility] to [facility] he [facility] before.  NCM contacted wife and she gave permission to fax out to [employer] area.  Patient info was faxed out on 7/17 at 10:20 am.  Awaiting bed offers. [place]  Has been started today 7/17 at 1100.   Expected Discharge Plan: Skilled Nursing Facility Barriers to Discharge: No Barriers Identified   Patient Goals and CMS Choice Patient states their goals for this hospitalization and ongoing recovery are:: To get better CMS Medicare.gov Compare Post Acute Care list provided to:: Patient Represenative (must comment)(wife) Choice offered to / list presented to : Spouse  Expected Discharge Plan and Services Expected Discharge Plan: [place]   Discharge Planning Services: CM Consult Post Acute Care Choice: [place] Living arrangements for the past 2 months: Single Family Home                 DME Arranged: (NA)         HH Arranged: NA          Prior Living Arrangements/Services Living arrangements for the past 2 months: Single Family Home Lives with:: Spouse Patient language and need for interpreter reviewed:: Yes Do you feel safe going back to the place where you live?: Yes      Need for Family Participation in Patient Care: Yes (Comment) Care giver support system in place?: Yes (comment)   Criminal Activity/Legal Involvement Pertinent to Current Situation/Hospitalization: No - Comment as needed  Activities of Daily Living   ADL Screening (condition at time of admission) Is the patient  deaf or have difficulty hearing?: No Does the patient have difficulty seeing, even when wearing glasses/contacts?: No Does the patient have difficulty concentrating, remembering, or making decisions?: Yes Does the patient have difficulty dressing or bathing?: Yes Does the patient have difficulty walking or climbing stairs?: Yes  Permission Sought/Granted Permission sought to share information with : Facility Sport and exercise psychologist, Family Supports Permission granted to share information with : Yes, Verbal Permission Granted  Share Information with NAME: Elma Limas  Permission granted to share info w AGENCY: yes  Permission granted to share info w Relationship: wife  Permission granted to share info w Contact Information: [phone]  Emotional Assessment Appearance:: Appears stated age Attitude/Demeanor/Rapport: Gracious Affect (typically observed): Appropriate Orientation: : Fluctuating Orientation (Suspected and/or reported Sundowners) Alcohol / Substance Use: Tobacco Use Psych Involvement: No (comment)  Admission diagnosis:  Weakness [R53.1] Community acquired pneumonia of left lower lobe of lung ([place]) [J18.1] Patient Active Problem List   Diagnosis Date Noted  . SIRS (systemic inflammatory response syndrome) ([place]) 10/09/2018  . Lower extremity weakness 10/09/2018  . Community acquired pneumonia of left lower lobe of lung ([place]) 10/09/2018  . Type II or unspecified type diabetes mellitus without mention of complication, uncontrolled 11/05/2013  . Gout 11/05/2013  . Obesity (BMI 30-39.9) 11/03/2013  . COPD (chronic obstructive pulmonary disease) with chronic bronchitis ([place]) 11/02/2013  . Chronic diastolic congestive heart failure ([place]) 11/02/2013  . Leukocytosis,  unspecified 11/02/2013  . Acute blood loss anemia 11/02/2013  . Left displaced femoral neck fracture (HCC) 10/31/2013  . Seizure disorder (HCC) 10/31/2013  . Unspecified disorder of thyroid 04/30/2011  . Tobacco  abuse 04/15/2011  . Diabetes mellitus (HCC) 04/15/2011  . Hypertension 04/15/2011  . Hyperlipidemia 04/15/2011   PCP:  [name], [name], MD Pharmacy:   [facility] [phone] [address]- [place], [place] - [phone] [facility] [address] [place] [id number] Phone: [phone] Fax: [phone]     Social Determinants of Health (SDOH) Interventions    Readmission Risk Interventions No flowsheet data found.

## 2018-10-10 NOTE — Progress Notes (Signed)
PROGRESS NOTE    Keith Wise  HEN:[id number] DOB: 1940-05-05 DOA: 10/08/2018 PCP: Leonard Downing, MD   Brief Narrative: 78 year old with past medical history of COPD with ongoing tobacco abuse, hypertension, previous history of seizure as a childhood and diabetes type 2 who was brought to the ER after patient had a fall at home.  Per family patient has been getting progressively weaker over the last 6 months has become partially bedbound.  He has some numbness in his upper extremity and had difficulty relating and PCP has sent him to physical therapy initially.  Patient has been having difficulty with ambulation since December.  Evaluation in the ED patient was found to be tachycardic temperature 100, leukocytosis, chest x-ray showing infiltrates concerning for pneumonia. COVID  test negative.  Assessment & Plan:   Principal Problem:   SIRS (systemic inflammatory response syndrome) (HCC) Active Problems:   Hypertension   Hyperlipidemia   COPD (chronic obstructive pulmonary disease) with chronic bronchitis (HCC)   Gout   Lower extremity weakness   Community acquired pneumonia of left lower lobe of lung ([place])  1-Community-acquired pneumonia, SIRS; Patient presents with fever, chest x-ray with infiltrates, leukocytosis. COVID  test negative. Continue with IV antibiotics, ceftriaxone and azithromycin  Follow blood cultures Strep pneumonia antigen negative  2-frequent fall, progressive weakness over the last 6 months: CT head and C-spine ; no acute intracranial or cervical spine finding.  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 Degenerative disease which could cause mild cord impingement at C1-2 and C2-3. 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  Start aspirin and check ECHO.   Hypertension: Continue lisinopril.  Add PRN hydralazine. Start Norvasc for  better blood pressure controlled.   Diabetes type 2: Continue with sliding scale insulin.  History of gout: Continue with allopurinol.  COPD stable.  Hyperlipidemia continue with statins.  Tobacco abuse advised quitting.  Alcohol use monitor on CIWA protocol.  On thiamine    Estimated body mass index is 24.74 kg/m as calculated from the following:   Height as of this encounter: 5\' 9"  (1.753 m).   Weight as of this encounter: 76 kg.   DVT prophylaxis: Lovenox Code Status: DNR Family Communication: Disposition Plan: plan to discharge to SNF when insurance approved. Awaiting Cervical spine results.  Consultants:     Procedures:   None  Antimicrobials:  Ceftriaxone and azithromycin  Subjective: Reports that lower extremity weakness worse on the left more than 6 months. Reports productive cough and shortness of breath  Objective: Vitals:   10/09/18 0815 10/09/18 1731 10/09/18 2304 10/10/18 0939  BP: (!) 168/83 (!) 153/92 (!) 163/97 (!) 175/96  Pulse: 66 60 62   Resp: 17 17 20    Temp: (!) 97.5 F (36.4 C) (!) 97.5 F (36.4 C) 98.1 F (36.7 C)   TempSrc: Oral Oral Oral   SpO2: 96% 97% 95%   Weight:      Height:        Intake/Output Summary (Last 24 hours) at 10/10/2018 1356 Last data filed at 10/10/2018 1118 Gross per 24 hour  Intake 1530.06 ml  Output 4125 ml  Net -2594.94 ml   Filed Weights   10/09/18 0530  Weight: 76 kg    Examination:  General exam: NAD Respiratory system:Bilateral ronchus.  Cardiovascular system: S 1, S 2 RRR Gastrointestinal system: BS present, soft, nt Central nervous system: alert and oriented, bilateral  LE weakness, worse left.  Extremities: no edema Skin: no rashes   Data Reviewed: I have personally reviewed following labs and imaging studies  CBC: Recent Labs  Lab 10/08/18 2101 10/09/18 1025 10/10/18 0429  WBC 18.7* 11.8* 8.8  NEUTROABS 16.0*  --   --   HGB 15.4 13.7 13.9  HCT 45.7 41.2 42.1  MCV 101.1*  102.0* 101.4*  PLT 265 223 239   Basic Metabolic Panel: Recent Labs  Lab 10/08/18 2101 10/09/18 1025 10/10/18 0429  NA 133* 140 140  K 4.2 3.8 3.8  CL 101 110 108  CO2 21* 23 24  GLUCOSE 167* 133* 81  BUN 12 7* 9  CREATININE 1.06 0.84 0.74  CALCIUM 9.9 9.0 9.9  MG  --   --  1.9  PHOS  --   --  2.8   GFR: Estimated Creatinine Clearance: 78.6 mL/min (by C-G formula based on SCr of 0.74 mg/dL). Liver Function Tests: Recent Labs  Lab 10/08/18 2101  AST 17  ALT 12  ALKPHOS 68  BILITOT 0.6  PROT 6.3*  ALBUMIN 3.6   No results for input(s): LIPASE, AMYLASE in the last 168 hours. No results for input(s): AMMONIA in the last 168 hours. Coagulation Profile: No results for input(s): INR, PROTIME in the last 168 hours. Cardiac Enzymes: Recent Labs  Lab 10/09/18 1025  CKTOTAL 107   BNP (last 3 results) No results for input(s): PROBNP in the last 8760 hours. HbA1C: No results for input(s): HGBA1C in the last 72 hours. CBG: Recent Labs  Lab 10/09/18 1206 10/09/18 1732 10/09/18 2045 10/10/18 0808 10/10/18 1214  GLUCAP 75 94 143* 90 180*   Lipid Profile: No results for input(s): CHOL, HDL, LDLCALC, TRIG, CHOLHDL, LDLDIRECT in the last 72 hours. Thyroid Function Tests: Recent Labs    10/09/18 1025  TSH 0.672   Anemia Panel: Recent Labs    10/09/18 1025  VITAMINB12 930*   Sepsis Labs: Recent Labs  Lab 10/08/18 2101  LATICACIDVEN 1.6    Recent Results (from the past 240 hour(s))  SARS Coronavirus 2 (CEPHEID- Performed in [facility] hospital lab), Hosp Order     Status: None   Collection Time: 10/09/18  2:14 AM   Specimen: Nasopharyngeal Swab  Result Value Ref Range Status   SARS Coronavirus 2 NEGATIVE NEGATIVE Final    Comment: (NOTE) If result is NEGATIVE SARS-CoV-2 target nucleic acids are NOT DETECTED. The SARS-CoV-2 RNA is generally detectable in upper and lower  respiratory specimens during the acute phase of infection. The lowest    concentration of SARS-CoV-2 viral copies this assay can detect is 250  copies / mL. A negative result does not preclude SARS-CoV-2 infection  and should not be used as the sole basis for treatment or other  patient management decisions.  A negative result may occur with  improper specimen collection / handling, submission of specimen other  than nasopharyngeal swab, presence of viral mutation(s) within the  areas targeted by this assay, and inadequate number of viral copies  (<250 copies / mL). A negative result must be combined with clinical  observations, patient history, and epidemiological information. If result is POSITIVE SARS-CoV-2 target nucleic acids are DETECTED. The SARS-CoV-2 RNA is generally detectable in upper and lower  respiratory specimens dur ing the acute phase of infection.  Positive  results are indicative of active infection with SARS-CoV-2.  Clinical  correlation with patient history and other diagnostic information is  necessary to determine patient infection status.  Positive results do  not rule out bacterial infection or co-infection with other viruses. If result is PRESUMPTIVE POSTIVE SARS-CoV-2 nucleic acids MAY BE PRESENT.   A presumptive positive result was obtained on the submitted specimen  and confirmed on repeat testing.  While 2019 novel coronavirus  (SARS-CoV-2) nucleic acids may be present in the submitted sample  additional confirmatory testing may be necessary for epidemiological  and / or clinical management purposes  to differentiate between  SARS-CoV-2 and other Sarbecovirus currently known to infect humans.  If clinically indicated additional testing with an alternate test  methodology [phone]([id number]) is advised. The SARS-CoV-2 RNA is generally  detectable in upper and lower respiratory sp ecimens during the acute  phase of infection. The expected result is Negative. Fact Sheet for Patients:  BoilerBrush.com.cyhttps://www.fda.gov/media/136312/download Fact Sheet  for Healthcare Providers: https://pope.com/https://www.fda.gov/media/136313/download This test is not yet approved or cleared by the [place] FDA and has been authorized for detection and/or diagnosis of SARS-CoV-2 by FDA under an Emergency Use Authorization (EUA).  This EUA will remain in effect (meaning this test can be used) for the duration of the COVID-19 declaration under Section 564(b)(1) of the Act, 21 U.S.C. section 360bbb-3(b)(1), unless the authorization is terminated or revoked sooner. Performed at [facility], [address] [place], [place] [address]          Radiology Studies: Ct Head Wo Contrast  Result Date: 10/09/2018 CLINICAL DATA:  Multiple falls with progressive weakness for several months EXAM: CT HEAD WITHOUT CONTRAST CT CERVICAL SPINE WITHOUT CONTRAST TECHNIQUE: Multidetector CT imaging of the head and cervical spine was performed following the standard protocol without intravenous contrast. Multiplanar CT image reconstructions of the cervical spine were also generated. COMPARISON:  Cervical radiography 01/18/2010 FINDINGS: CT HEAD FINDINGS Brain: No evidence of acute infarction, hemorrhage, hydrocephalus, extra-axial collection or mass lesion/mass effect. Extensive low-density in the cerebral white matter attributed to chronic small vessel ischemia. Small remote left occipital cortex infarct. Vascular: Atherosclerotic calcification. Skull: Negative for fracture or erosion. Sinuses/Orbits: Negative CT CERVICAL SPINE FINDINGS Alignment: Normal alignment Skull base and vertebrae: Negative for acute fracture. Soft tissues and spinal canal: No prevertebral fluid or swelling. No visible canal hematoma. Disc levels: Prominent ligamentous thickening and mineralization about the dens with possible mild mass effect on the cervicomedullary junction. No superimposed acute erosion in the dens. C2-3 advanced disc degeneration with biforaminal stenosis. There may also be cord impingement  at this level. C3-4 ACDF with solid arthrodesis. C4-5 advanced disc degeneration with spinal stenosis and biforaminal impingement C5-C7 ACDF with solid arthrodesis. Upper chest: Emphysema. IMPRESSION: 1. No acute intracranial or cervical spine finding. 2. 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 3. Degenerative disease which could cause mild cord impingement at C1-2 and C2-3. Electronically Signed   By: Marnee SpringJonathon  Watts M.D.   On: 10/09/2018 05:00   Ct Cervical Spine Wo Contrast  Result Date: 10/09/2018 CLINICAL DATA:  Multiple falls with progressive weakness for several months EXAM: CT HEAD WITHOUT CONTRAST CT CERVICAL SPINE WITHOUT CONTRAST TECHNIQUE: Multidetector CT imaging of the head and cervical spine was performed following the standard protocol without intravenous contrast. Multiplanar CT image reconstructions of the cervical spine were also generated. COMPARISON:  Cervical radiography 01/18/2010 FINDINGS: CT HEAD FINDINGS Brain: No evidence of acute infarction, hemorrhage, hydrocephalus, extra-axial collection or mass lesion/mass effect. Extensive low-density in the cerebral white matter attributed to chronic small vessel ischemia. Small remote left occipital cortex infarct. Vascular: Atherosclerotic calcification. Skull: Negative for fracture or erosion.  Sinuses/Orbits: Negative CT CERVICAL SPINE FINDINGS Alignment: Normal alignment Skull base and vertebrae: Negative for acute fracture. Soft tissues and spinal canal: No prevertebral fluid or swelling. No visible canal hematoma. Disc levels: Prominent ligamentous thickening and mineralization about the dens with possible mild mass effect on the cervicomedullary junction. No superimposed acute erosion in the dens. C2-3 advanced disc degeneration with biforaminal stenosis. There may also be cord impingement at this level. C3-4 ACDF with solid arthrodesis. C4-5 advanced disc degeneration with spinal stenosis and biforaminal impingement  C5-C7 ACDF with solid arthrodesis. Upper chest: Emphysema. IMPRESSION: 1. No acute intracranial or cervical spine finding. 2. 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 3. Degenerative disease which could cause mild cord impingement at C1-2 and C2-3. Electronically Signed   By: Marnee SpringJonathon  Watts M.D.   On: 10/09/2018 05:00   Mr Brain Wo Contrast  Result Date: 10/09/2018 CLINICAL DATA:  Initial evaluation for acute headache status post head trauma, progressive weakness over past 6 months. EXAM: MRI HEAD WITHOUT CONTRAST TECHNIQUE: Multiplanar, multiecho pulse sequences of the brain and surrounding structures were obtained without intravenous contrast. COMPARISON:  Prior CT from earlier the same day. FINDINGS: Brain: Diffuse prominence of the CSF containing spaces compatible with generalized age-related cerebral atrophy. Patchy and confluent T2/FLAIR hyperintensity within the periventricular and deep white matter both cerebral hemispheres most consistent with chronic small vessel ischemic disease, fairly advanced in nature. Chronic microvascular ischemic changes present within the pons. Few scattered superimposed remote lacunar infarcts present within the bilateral thalami. Probable small remote left occipital cortical infarct noted. No abnormal foci of restricted diffusion to suggest acute or subacute ischemia. Gray-white matter differentiation maintained. No other areas of remote cortical infarction. No foci of susceptibility artifact to suggest acute or chronic intracranial hemorrhage. No mass lesion, midline shift or mass effect. No hydrocephalus. No extra-axial fluid collection. Pituitary gland suprasellar region normal. Midline structures intact. Vascular: Major intracranial vascular flow voids are maintained. Mild arterial dolichoectasia noted about the circle-of-Willis. Skull and upper cervical spine: Craniocervical junction within normal limits. Postsurgical changes partially visualize  within the upper cervical spine. Bone marrow signal intensity normal. No scalp soft tissue abnormality. Sinuses/Orbits: Globes and orbital soft tissues within normal limits. Paranasal sinuses are largely clear. Small right mastoid effusion noted, of doubtful significance. Other: None. IMPRESSION: 1. No acute intracranial abnormality. 2. Age-related cerebral atrophy with advanced chronic microvascular ischemic disease involving the supratentorial cerebral white matter and pons. 3. Superimposed small remote left occipital cortical infarct. Electronically Signed   By: Rise MuBenjamin  McClintock M.D.   On: 10/09/2018 19:22   Dg Pelvis Portable  Result Date: 10/09/2018 CLINICAL DATA:  Pelvic pain and tenderness post fall last night. EXAM: PORTABLE PELVIS 1-2 VIEWS COMPARISON:  11/01/2013 FINDINGS: Diffuse decreased bone mineralization. Left hip arthroplasty intact and unchanged. Mild symmetric degenerative change of the hips. No acute fracture or dislocation. Mild degenerate change of the spine and sacroiliac joints. IMPRESSION: No acute findings. Mild degenerative change of the hips.  Stable left hip arthroplasty. Electronically Signed   By: Elberta Fortisaniel  Boyle M.D.   On: 10/09/2018 07:31   Dg Chest Port 1 View  Result Date: 10/09/2018 CLINICAL DATA:  Cough EXAM: PORTABLE CHEST 1 VIEW COMPARISON:  10/31/2013 FINDINGS: Postsurgical changes in the cervical spine. Right lung is clear. Airspace disease at the left base. Normal heart size. Aortic atherosclerosis. No pneumothorax. IMPRESSION: Patchy airspace disease at the left base, suspect for pneumonia. Electronically Signed   By: Jasmine PangKim  Fujinaga M.D.   On: 10/09/2018  01:27        Scheduled Meds:  allopurinol  200 mg Oral Daily   amLODipine  5 mg Oral Daily   enoxaparin (LOVENOX) injection  40 mg Subcutaneous Daily   folic acid  1 mg Oral Daily   gabapentin  300 mg Oral Daily   gabapentin  600 mg Oral QHS   insulin aspart  0-9 Units Subcutaneous TID WC    lisinopril  20 mg Oral Daily   multivitamin with minerals  1 tablet Oral Daily   pravastatin  20 mg Oral q1800   thiamine  100 mg Oral Daily   Continuous Infusions:  azithromycin Stopped (10/10/18 0720)   cefTRIAXone (ROCEPHIN)  IV Stopped (10/10/18 0720)     LOS: 1 day    Time spent: 35 minutes.     Alba CoryBelkys A Geraldo Haris, MD [facility] Pager [phone]  If 7PM-7AM, please contact night-coverage [email or web address] Password [email or web address] 10/10/2018, 1:56 PM

## 2018-10-11 ENCOUNTER — Inpatient Hospital Stay (HOSPITAL_COMMUNITY): Payer: Medicare PPO

## 2018-10-11 LAB — NOVEL CORONAVIRUS, NAA (HOSP ORDER, SEND-OUT TO REF LAB; TAT 18-24 HRS): SARS-CoV-2, NAA: NOT DETECTED

## 2018-10-11 LAB — GLUCOSE, CAPILLARY
Glucose-Capillary: 101 mg/dL — ABNORMAL HIGH (ref 70–99)
Glucose-Capillary: 157 mg/dL — ABNORMAL HIGH (ref 70–99)
Glucose-Capillary: 95 mg/dL (ref 70–99)
Glucose-Capillary: 173 mg/dL — ABNORMAL HIGH (ref 70–99)

## 2018-10-11 MED ORDER — LORAZEPAM 2 MG/ML IJ SOLN
1.0000 mg | Freq: Once | INTRAMUSCULAR | Status: AC
  Administered 2018-10-11: 1 mg via INTRAVENOUS
  Filled 2018-10-11: qty 1

## 2018-10-11 MED ORDER — GADOBUTROL 1 MMOL/ML IV SOLN
7.0000 mL | Freq: Once | INTRAVENOUS | Status: AC | PRN
  Administered 2018-10-11: 7 mL via INTRAVENOUS

## 2018-10-11 NOTE — Progress Notes (Signed)
PROGRESS NOTE    Keith Wise  WJX:[id number] DOB: 19-Apr-1940 DOA: 10/08/2018 PCP: Kaleen Mask, MD   Brief Narrative: 78 year old with past medical history of COPD with ongoing tobacco abuse, hypertension, previous history of seizure as a childhood and diabetes type 2 who was brought to the ER after patient had a fall at home.  Per family patient has been getting progressively weaker over the last 6 months has become partially bedbound.  He has some numbness in his upper extremity and had difficulty relating and PCP has sent him to physical therapy initially.  Patient has been having difficulty with ambulation since December.  Evaluation in the ED patient was found to be tachycardic temperature 100, leukocytosis, chest x-ray showing infiltrates concerning for pneumonia. COVID  test negative.  Assessment & Plan:   Principal Problem:   SIRS (systemic inflammatory response syndrome) (HCC) Active Problems:   Hypertension   Hyperlipidemia   COPD (chronic obstructive pulmonary disease) with chronic bronchitis (HCC)   Gout   Lower extremity weakness   Community acquired pneumonia of left lower lobe of lung (HCC)    1-Community-acquired pneumonia, SIRS; Patient presents with fever, chest x-ray with infiltrates, leukocytosis. COVID  test negative. Continue with IV antibiotics, ceftriaxone and azithromycin  Follow blood cultures; no growth to date  Strep pneumonia antigen negative  2-Frequent fall, progressive weakness over the last 6 months: CT head and C-spine ; no acute intracranial or cervical spine finding.  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 Degenerative disease which could cause mild cord impingement at C1-2 and C2-3. 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  Started  aspirin and  ECHO normal EF.   MRI cervical spine ; with multiples finding will  discussed with neurology.   Hypertension: Continue lisinopril.  Add PRN hydralazine. Start Norvasc for better blood pressure controlled.   Diabetes type 2: Continue with sliding scale insulin.  History of gout: Continue with allopurinol.  COPD stable.  Hyperlipidemia continue with statins.  Tobacco abuse advised quitting.  Alcohol use monitor on CIWA protocol.  On thiamine    Estimated body mass index is 24.74 kg/m as calculated from the following:   Height as of this encounter:  (1.753 m).   Weight as of this encounter: 76 kg.   DVT prophylaxis: Lovenox Code Status: DNR Family Communication: Disposition Plan: plan to discharge to SNF when insurance approved. Awaiting Cervical spine results.  Consultants:     Procedures:   None  Antimicrobials:  Ceftriaxone and azithromycin  Subjective: Agitated this am, wants to eat breakfast, refusing MRI cervical spine  Objective: Vitals:   10/10/18 0939 10/10/18 1744 10/10/18 2315 10/11/18 1017  BP: (!) 175/96 (!) 154/108 (!) 162/92 (!) 135/100  Pulse:  61 66 79  Resp:  18 17   Temp:   98.2 F (36.8 C)   TempSrc:   Oral   SpO2:  96% 95% 95%  Weight:      Height:        Intake/Output Summary (Last 24 hours) at 10/11/2018 1533 Last data filed at 10/11/2018 0610 Gross per 24 hour  Intake 240 ml  Output 3350 ml  Net -3110 ml   Filed Weights   10/09/18 0530  Weight: 76 kg    Examination:  General exam: NAD Respiratory system: Crackles bases.  Cardiovascular system: S 1, S 2 RRR Gastrointestinal system: BS present, soft, nt  Central nervous system: alert, oriented, BL LE weakness more the left Extremities: No edema,  Skin: no rashes   Data Reviewed: I have personally reviewed following labs and imaging studies  CBC: Recent Labs  Lab 10/08/18 2101 10/09/18 1025 10/10/18 0429  WBC 18.7* 11.8* 8.8  NEUTROABS 16.0*  --   --   HGB 15.4 13.7 13.9  HCT 45.7 41.2 42.1  MCV 101.1* 102.0* 101.4*  PLT  265 223 239   Basic Metabolic Panel: Recent Labs  Lab 10/08/18 2101 10/09/18 1025 10/10/18 0429  NA 133* 140 140  K 4.2 3.8 3.8  CL 101 110 108  CO2 21* 23 24  GLUCOSE 167* 133* 81  BUN 12 7* 9  CREATININE 1.06 0.84 0.74  CALCIUM 9.9 9.0 9.9  MG  --   --  1.9  PHOS  --   --  2.8   GFR: Estimated Creatinine Clearance: 78.6 mL/min (by C-G formula based on SCr of 0.74 mg/dL). Liver Function Tests: Recent Labs  Lab 10/08/18 2101  AST 17  ALT 12  ALKPHOS 68  BILITOT 0.6  PROT 6.3*  ALBUMIN 3.6   No results for input(s): LIPASE, AMYLASE in the last 168 hours. No results for input(s): AMMONIA in the last 168 hours. Coagulation Profile: No results for input(s): INR, PROTIME in the last 168 hours. Cardiac Enzymes: Recent Labs  Lab 10/09/18 1025  CKTOTAL 107   BNP (last 3 results) No results for input(s): PROBNP in the last 8760 hours. HbA1C: No results for input(s): HGBA1C in the last 72 hours. CBG: Recent Labs  Lab 10/10/18 1214 10/10/18 1744 10/10/18 2117 10/11/18 0744 10/11/18 1338  GLUCAP 180* 108* 168* 101* 157*   Lipid Profile: No results for input(s): CHOL, HDL, LDLCALC, TRIG, CHOLHDL, LDLDIRECT in the last 72 hours. Thyroid Function Tests: Recent Labs    10/09/18 1025  TSH 0.672   Anemia Panel: Recent Labs    10/09/18 1025  VITAMINB12 930*   Sepsis Labs: Recent Labs  Lab 10/08/18 2101  LATICACIDVEN 1.6    Recent Results (from the past 240 hour(s))  Blood Culture (routine x 2)     Status: None (Preliminary result)   Collection Time: 10/09/18  1:55 AM   Specimen: BLOOD  Result Value Ref Range Status   Specimen Description BLOOD RIGHT ANTECUBITAL  Final   Special Requests   Final    BOTTLES DRAWN AEROBIC AND ANAEROBIC Blood Culture adequate volume   Culture   Final    NO GROWTH 2 DAYS Performed at [facility], [address] [place], [place] [address]    Report Status PENDING  Incomplete  SARS Coronavirus 2 (CEPHEID-  Performed in [facility] hospital lab), Hosp Order     Status: None   Collection Time: 10/09/18  2:14 AM   Specimen: Nasopharyngeal Swab  Result Value Ref Range Status   SARS Coronavirus 2 NEGATIVE NEGATIVE Final    Comment: (NOTE) If result is NEGATIVE SARS-CoV-2 target nucleic acids are NOT DETECTED. The SARS-CoV-2 RNA is generally detectable in upper and lower  respiratory specimens during the acute phase of infection. The lowest  concentration of SARS-CoV-2 viral copies this assay can detect is 250  copies / mL. A negative result does not preclude SARS-CoV-2 infection  and should not be used as the sole basis for treatment or other  patient management decisions.  A negative result may occur with  improper specimen collection / handling, submission of specimen other  than nasopharyngeal  swab, presence of viral mutation(s) within the  areas targeted by this assay, and inadequate number of viral copies  (<250 copies / mL). A negative result must be combined with clinical  observations, patient history, and epidemiological information. If result is POSITIVE SARS-CoV-2 target nucleic acids are DETECTED. The SARS-CoV-2 RNA is generally detectable in upper and lower  respiratory specimens dur ing the acute phase of infection.  Positive  results are indicative of active infection with SARS-CoV-2.  Clinical  correlation with patient history and other diagnostic information is  necessary to determine patient infection status.  Positive results do  not rule out bacterial infection or co-infection with other viruses. If result is PRESUMPTIVE POSTIVE SARS-CoV-2 nucleic acids MAY BE PRESENT.   A presumptive positive result was obtained on the submitted specimen  and confirmed on repeat testing.  While 2019 novel coronavirus  (SARS-CoV-2) nucleic acids may be present in the submitted sample  additional confirmatory testing may be necessary for epidemiological  and / or clinical management  purposes  to differentiate between  SARS-CoV-2 and other Sarbecovirus currently known to infect humans.  If clinically indicated additional testing with an alternate test  methodology [phone] is advised. The SARS-CoV-2 RNA is generally  detectable in upper and lower respiratory sp ecimens during the acute  phase of infection. The expected result is Negative. Fact Sheet for Patients:  BoilerBrush.com.cyhttps://www.fda.gov/media/136312/download Fact Sheet for Healthcare Providers: https://pope.com/https://www.fda.gov/media/136313/download This test is not yet approved or cleared by the [place] FDA and has been authorized for detection and/or diagnosis of SARS-CoV-2 by FDA under an Emergency Use Authorization (EUA).  This EUA will remain in effect (meaning this test can be used) for the duration of the COVID-19 declaration under Section 564(b)(1) of the Act, 21 U.S.C. section 360bbb-3(b)(1), unless the authorization is terminated or revoked sooner. Performed at [facility], [address] [place], [place] [address]   Blood Culture (routine x 2)     Status: None (Preliminary result)   Collection Time: 10/09/18  2:14 AM   Specimen: BLOOD LEFT WRIST  Result Value Ref Range Status   Specimen Description BLOOD LEFT WRIST  Final   Special Requests   Final    BOTTLES DRAWN AEROBIC AND ANAEROBIC Blood Culture adequate volume   Culture   Final    NO GROWTH 2 DAYS Performed at [facility], [address] [place], [place] [address]    Report Status PENDING  Incomplete         Radiology Studies: Mr Brain 50Wo Contrast  Result Date: 10/09/2018 CLINICAL DATA:  Initial evaluation for acute headache status post head trauma, progressive weakness over past 6 months. EXAM: MRI HEAD WITHOUT CONTRAST TECHNIQUE: Multiplanar, multiecho pulse sequences of the brain and surrounding structures were obtained without intravenous contrast. COMPARISON:  Prior CT from earlier the same day. FINDINGS: Brain: Diffuse  prominence of the CSF containing spaces compatible with generalized age-related cerebral atrophy. Patchy and confluent T2/FLAIR hyperintensity within the periventricular and deep white matter both cerebral hemispheres most consistent with chronic small vessel ischemic disease, fairly advanced in nature. Chronic microvascular ischemic changes present within the pons. Few scattered superimposed remote lacunar infarcts present within the bilateral thalami. Probable small remote left occipital cortical infarct noted. No abnormal foci of restricted diffusion to suggest acute or subacute ischemia. Gray-white matter differentiation maintained. No other areas of remote cortical infarction. No foci of susceptibility artifact to suggest acute or chronic intracranial hemorrhage. No mass lesion, midline shift or mass  effect. No hydrocephalus. No extra-axial fluid collection. Pituitary gland suprasellar region normal. Midline structures intact. Vascular: Major intracranial vascular flow voids are maintained. Mild arterial dolichoectasia noted about the circle-of-Willis. Skull and upper cervical spine: Craniocervical junction within normal limits. Postsurgical changes partially visualize within the upper cervical spine. Bone marrow signal intensity normal. No scalp soft tissue abnormality. Sinuses/Orbits: Globes and orbital soft tissues within normal limits. Paranasal sinuses are largely clear. Small right mastoid effusion noted, of doubtful significance. Other: None. IMPRESSION: 1. No acute intracranial abnormality. 2. Age-related cerebral atrophy with advanced chronic microvascular ischemic disease involving the supratentorial cerebral white matter and pons. 3. Superimposed small remote left occipital cortical infarct. Electronically Signed   By: Jeannine Boga M.D.   On: 10/09/2018 19:22   Mr Cervical Spine W Wo Contrast  Result Date: 10/11/2018 CLINICAL DATA:  [place] [address] at home.  Worsening progressive weakness. EXAM: MRI  CERVICAL SPINE WITHOUT AND WITH CONTRAST TECHNIQUE: Multiplanar and multiecho pulse sequences of the cervical spine, to include the craniocervical junction and cervicothoracic junction, were obtained without and with intravenous contrast. CONTRAST:  7 cc Gadavist COMPARISON:  CT 10/09/2018 FINDINGS: Alignment: Normal Vertebrae: Previous ACDF at C3-4 and from C5 through C7 with solid union. Cord: No primary cord lesion.  See below regarding stenosis at C4-5. Posterior Fossa, vertebral arteries, paraspinal tissues: Negative Disc levels: Foramen magnum is widely patent. Osteoarthritis of the C1-2 articulation with mild narrowing of the spinal canal but no compression of the cord. Ample subarachnoid space surrounds the cord. C2-3: Spondylosis with endplate osteophytes and mild bulging of the disc. Bilateral facet osteoarthritis. Canal narrowing with effacement of the subarachnoid space surrounding the cord but no compression of the cord. Bilateral foraminal narrowing could affect either C3 nerve. C3-4: Distant ACDF has a good appearance with wide patency of the canal and foramina. C4-5: Spondylosis with endplate osteophytes and protruding disc material. Bilateral facet arthropathy and hypertrophy. Severe spinal stenosis with AP diameter of the canal only 4.5 mm, effacing the subarachnoid space and deforming the cord. Early abnormal T2 signal within the cord. Bilateral foraminal stenosis at this level. C5 through C7: Previous ACDF with wide patency of the canal and foramina. C7-T1: Facet osteoarthritis with 1 mm of anterolisthesis. No canal stenosis. Foraminal narrowing on the right because of osteophytic encroachment could possibly affect the right C8 nerve. Left foramen appears widely patent. IMPRESSION: Previous ACDF at C3-4 and from C5 through C7 with solid union and wide patency of the canal and foramina at those levels. Severe spinal stenosis at the mobile C4-5 level with AP diameter of the canal only 4.5 mm.  Endplate osteophytes and protruding disc material. Facet and ligamentous hypertrophy. Flattening of the cord with some T2 signal in the cord. Bilateral foraminal stenosis could affect either C5 nerve. C2-3: Spondylosis and facet osteoarthritis with mild canal narrowing but no cord compression. Bilateral foraminal narrowing could affect either C3 nerve. C1-2: Osteoarthritis with canal narrowing but no cord compression C7-T1: Facet osteoarthritis with 1 mm of anterolisthesis. Foraminal narrowing on the right could possibly affect the right C8 nerve. No compressive central canal stenosis. Electronically Signed   By: Nelson Chimes M.D.   On: 10/11/2018 14:03        Scheduled Meds:  allopurinol  200 mg Oral Daily   amLODipine  5 mg Oral Daily   aspirin EC  81 mg Oral Daily   enoxaparin (LOVENOX) injection  40 mg Subcutaneous Daily   folic acid  1 mg Oral Daily  gabapentin  300 mg Oral Daily   gabapentin  600 mg Oral QHS   insulin aspart  0-9 Units Subcutaneous TID WC   lisinopril  20 mg Oral Daily   multivitamin with minerals  1 tablet Oral Daily   pravastatin  20 mg Oral q1800   thiamine  100 mg Oral Daily   Continuous Infusions:  azithromycin Stopped (10/11/18 0720)   cefTRIAXone (ROCEPHIN)  IV Stopped (10/11/18 0720)     LOS: 2 days    Time spent: 35 minutes.     Alba CoryBelkys A Naomia Lenderman, MD [facility] Pager [phone]  If 7PM-7AM, please contact night-coverage [email or web address] Password [email or web address] 10/11/2018, 3:33 PM

## 2018-10-11 NOTE — Plan of Care (Signed)
  Problem: Health Behavior/Discharge Planning: Goal: Ability to manage health-related needs will improve Outcome: Progressing   

## 2018-10-12 LAB — BASIC METABOLIC PANEL
Anion gap: 9 (ref 5–15)
BUN: 11 mg/dL (ref 8–23)
CO2: 22 mmol/L (ref 22–32)
Calcium: 10.2 mg/dL (ref 8.9–10.3)
Chloride: 105 mmol/L (ref 98–111)
Creatinine, Ser: 0.69 mg/dL (ref 0.61–1.24)
GFR calc Af Amer: >60 mL/min (ref >60)
GFR calc non Af Amer: >60 mL/min (ref >60)
Glucose, Bld: 95 mg/dL (ref 70–99)
Potassium: 4.2 mmol/L (ref 3.5–5.1)
Sodium: 136 mmol/L (ref 135–145)

## 2018-10-12 LAB — CBC
HCT: 46.4 % (ref 39.0–52.0)
Hemoglobin: 15.8 g/dL (ref 13.0–17.0)
MCH: 34.1 pg — ABNORMAL HIGH (ref 26.0–34.0)
MCHC: 34.1 g/dL (ref 30.0–36.0)
MCV: 100.2 fL — ABNORMAL HIGH (ref 80.0–100.0)
Platelets: 275 10*3/uL (ref 150–400)
RBC: 4.63 MIL/uL (ref 4.22–5.81)
RDW: 12.5 % (ref 11.5–15.5)
WBC: 7.2 10*3/uL (ref 4.0–10.5)
nRBC: 0 % (ref 0.0–0.2)

## 2018-10-12 LAB — GLUCOSE, CAPILLARY
Glucose-Capillary: 96 mg/dL (ref 70–99)
Glucose-Capillary: 116 mg/dL — ABNORMAL HIGH (ref 70–99)
Glucose-Capillary: 121 mg/dL — ABNORMAL HIGH (ref 70–99)
Glucose-Capillary: 152 mg/dL — ABNORMAL HIGH (ref 70–99)

## 2018-10-12 MED ORDER — AZITHROMYCIN 250 MG PO TABS
500.0000 mg | ORAL_TABLET | Freq: Every day | ORAL | Status: DC
  Administered 2018-10-12: 500 mg via ORAL
  Filled 2018-10-12: qty 2

## 2018-10-12 NOTE — Consult Note (Signed)
Reason for Consult: Weakness Referring Physician: [facility]  Keith Wise is an 78 y.o. male.  HPI: 78 year old gentleman with 6 to 8 months of weakness both hands and legs he has been apparently nonambulatory since at least January or December.  He reports around this time there may have been a fall where he fell out of a rocking chair striking the left side of his head and neck.  He reports numbness tingling weakness in his hands and inability to ambulate.  He was admitted to the emergency room a couple days ago was diagnosed with a pneumonia work-up included CT scan of his head neck and MRI of the cervical spine.  Past Medical History:  Diagnosis Date  . COPD (chronic obstructive pulmonary disease) (HCC)   . Diabetes mellitus   . Hypercholesteremia   . Hypertension   . Seizures (HCC)    last seizure in the 80's   . Shortness of breath     Past Surgical History:  Procedure Laterality Date  . BACK SURGERY    . CERVICAL DISC SURGERY    . HEMIARTHROPLASTY HIP Left 11/01/2013   dr Eulah Pontmurphy  . HIP ARTHROPLASTY Left 11/01/2013   Procedure: Left hip  Hemiarthroplasty;  Surgeon: Sheral Apleyimothy D Murphy, MD;  [facility];  Service: Orthopedics;  Laterality: Left;  . ORTHOPEDIC SURGERY     lumbar, cervical    Family History  Problem Relation Age of Onset  . Diabetes type II Other     Social History:  reports that he has been smoking cigarettes. He has a 110.00 pack-year smoking history. He has never used smokeless tobacco. He reports that he does not drink alcohol or use drugs.  Allergies: No Known Allergies  Medications: I have reviewed the patient's current medications.  Results for orders placed or performed during the hospital encounter of 10/08/18 (from the past 48 hour(s))  Glucose, capillary     Status: Abnormal   Collection Time: 10/10/18 12:14 PM  Result Value Ref Range   Glucose-Capillary 180 (H) 70 - 99 mg/dL  Glucose, capillary     Status: Abnormal   Collection  Time: 10/10/18  5:44 PM  Result Value Ref Range   Glucose-Capillary 108 (H) 70 - 99 mg/dL  Novel Coronavirus, NAA (hospital order; send-out to ref lab)     Status: None   Collection Time: 10/10/18  6:20 PM   Specimen: Nasopharyngeal Swab; Respiratory  Result Value Ref Range   SARS-CoV-2, NAA NOT DETECTED NOT DETECTED    Comment: (NOTE) This test was developed and its performance characteristics determined by [facility]. This test has not been FDA cleared or approved. This test has been authorized by FDA under an Emergency Use Authorization (EUA). This test is only authorized for the duration of time the declaration that circumstances exist justifying the authorization of the emergency use of in vitro diagnostic tests for detection of SARS-CoV-2 virus and/or diagnosis of COVID-19 infection under section 564(b)(1) of the Act, 21 U.S.C. 409WJX-9(J)(4360bbb-3(b)(1), unless the authorization is terminated or revoked sooner. When diagnostic testing is negative, the possibility of a false negative result should be considered in the context of a patient's recent exposures and the presence of clinical signs and symptoms consistent with COVID-19. An individual without symptoms of COVID-19 and who is not shedding SARS-CoV-2 virus would expect to have a negative (not detected) result in this assay. Performed  At: [facility] [address] [place], [place] [id number] Jolene SchimkeNagendra Sanjai MD YQ:[phone]    Coronavirus  Source NASOPHARYNGEAL     Comment: Performed at [facility], [address], [place], [place] [address]  Glucose, capillary     Status: Abnormal   Collection Time: 10/10/18  9:17 PM  Result Value Ref Range   Glucose-Capillary 168 (H) 70 - 99 mg/dL  Glucose, capillary     Status: Abnormal   Collection Time: 10/11/18  7:44 AM  Result Value Ref Range   Glucose-Capillary 101 (H) 70 - 99 mg/dL  Glucose, capillary     Status: Abnormal   Collection Time: 10/11/18  1:38 PM   Result Value Ref Range   Glucose-Capillary 157 (H) 70 - 99 mg/dL  Glucose, capillary     Status: None   Collection Time: 10/11/18  5:21 PM  Result Value Ref Range   Glucose-Capillary 95 70 - 99 mg/dL  Glucose, capillary     Status: Abnormal   Collection Time: 10/11/18  9:31 PM  Result Value Ref Range   Glucose-Capillary 173 (H) 70 - 99 mg/dL  Glucose, capillary     Status: None   Collection Time: 10/12/18  7:39 AM  Result Value Ref Range   Glucose-Capillary 96 70 - 99 mg/dL    Mr Cervical Spine W Wo Contrast  Result Date: 10/11/2018 CLINICAL DATA:  [address] at home.  Worsening progressive weakness. EXAM: MRI CERVICAL SPINE WITHOUT AND WITH CONTRAST TECHNIQUE: Multiplanar and multiecho pulse sequences of the cervical spine, to include the craniocervical junction and cervicothoracic junction, were obtained without and with intravenous contrast. CONTRAST:  7 cc Gadavist COMPARISON:  CT 10/09/2018 FINDINGS: Alignment: Normal Vertebrae: Previous ACDF at C3-4 and from C5 through C7 with solid union. Cord: No primary cord lesion.  See below regarding stenosis at C4-5. Posterior Fossa, vertebral arteries, paraspinal tissues: Negative Disc levels: Foramen magnum is widely patent. Osteoarthritis of the C1-2 articulation with mild narrowing of the spinal canal but no compression of the cord. Ample subarachnoid space surrounds the cord. C2-3: Spondylosis with endplate osteophytes and mild bulging of the disc. Bilateral facet osteoarthritis. Canal narrowing with effacement of the subarachnoid space surrounding the cord but no compression of the cord. Bilateral foraminal narrowing could affect either C3 nerve. C3-4: Distant ACDF has a good appearance with wide patency of the canal and foramina. C4-5: Spondylosis with endplate osteophytes and protruding disc material. Bilateral facet arthropathy and hypertrophy. Severe spinal stenosis with AP diameter of the canal only 4.5 mm, effacing the subarachnoid space and  deforming the cord. Early abnormal T2 signal within the cord. Bilateral foraminal stenosis at this level. C5 through C7: Previous ACDF with wide patency of the canal and foramina. C7-T1: Facet osteoarthritis with 1 mm of anterolisthesis. No canal stenosis. Foraminal narrowing on the right because of osteophytic encroachment could possibly affect the right C8 nerve. Left foramen appears widely patent. IMPRESSION: Previous ACDF at C3-4 and from C5 through C7 with solid union and wide patency of the canal and foramina at those levels. Severe spinal stenosis at the mobile C4-5 level with AP diameter of the canal only 4.5 mm. Endplate osteophytes and protruding disc material. Facet and ligamentous hypertrophy. Flattening of the cord with some T2 signal in the cord. Bilateral foraminal stenosis could affect either C5 nerve. C2-3: Spondylosis and facet osteoarthritis with mild canal narrowing but no cord compression. Bilateral foraminal narrowing could affect either C3 nerve. C1-2: Osteoarthritis with canal narrowing but no cord compression C7-T1: Facet osteoarthritis with 1 mm of anterolisthesis. Foraminal narrowing on the right could possibly affect the  right C8 nerve. No compressive central canal stenosis. Electronically Signed   By: Paulina FusiMark  Shogry M.D.   On: 10/11/2018 14:03    Review of Systems  Musculoskeletal: Positive for neck pain.  Neurological: Positive for tingling, sensory change and weakness.   Blood pressure (!) 153/92, pulse 66, temperature 98.4 F (36.9 C), temperature source Oral, resp. rate 12, height 5\' 9"  (1.753 m), weight 76 kg, SpO2 95 %. Physical Exam  Neurological: He is alert. GCS eye subscore is 4. GCS verbal subscore is 5. GCS motor subscore is 6.  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  Okay to initiate physical and occupational therapy with patient he will need an ACDF at C4-5.  However recent diagnosis of pneumonia I would like to have that treated adequately before we consider surgery.  I would ask for recommendations from [facility] as to whether we need to wait 1 or 2 weeks we are putting implants and so I would prefer to ensure adequate treatment.  Yalda Herd P 10/12/2018, 8:24 AM

## 2018-10-12 NOTE — Progress Notes (Signed)
PROGRESS NOTE    Keith Wise  ZOX:[id number] DOB: [birth year] DOA: 10/08/2018 PCP: [name], [name], MD   Brief Narrative: 78 year old with past medical history of COPD with ongoing tobacco abuse, hypertension, previous history of seizure as a childhood and diabetes type 2 who was brought to the ER after patient had a fall at home.  Per family patient has been getting progressively weaker over the last 6 months has become partially bedbound.  He has some numbness in his upper extremity and had difficulty relating and PCP has sent him to physical therapy initially.  Patient has been having difficulty with ambulation since December.  Evaluation in the ED patient was found to be tachycardic temperature 100, leukocytosis, chest x-ray showing infiltrates concerning for pneumonia. COVID  test negative.  Assessment & Plan:   Principal Problem:   SIRS (systemic inflammatory response syndrome) (HCC) Active Problems:   Hypertension   Hyperlipidemia   COPD (chronic obstructive pulmonary disease) with chronic bronchitis (HCC)   Gout   Lower extremity weakness   Community acquired pneumonia of left lower lobe of lung (HCC)    1-Community-acquired pneumonia, SIRS; Patient presents with fever, chest x-ray with infiltrates, leukocytosis. COVID  test negative. Continue with IV antibiotics, ceftriaxone and azithromycin  Follow blood cultures; no growth to date  Strep pneumonia antigen negative Day 4 antibiotics. Would treat for total of 10 days.   2-Frequent fall, progressive weakness over the last 6 months: CT head and C-spine ; no acute intracranial or cervical spine finding.  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 Degenerative disease which could cause mild cord impingement at C1-2 and C2-3. Check B12 normal at 930, CK level 107, TSH 0.6 MRI brain showed old stroke. Started  aspirin and  ECHO normal EF.   MRI cervical spine ; with multiples finding cord compression  C 4--5. Neurosurgery consulted.  If Neurosurgery consider patient is neurologically stable and procedure can be delayed 1 week to document resolution of pneumonia, in the events that implants are needed, I agree with waiting 1 week for procedure.   Hypertension: Continue lisinopril.  Add PRN hydralazine. Start Norvasc for better blood pressure controlled.   Diabetes type 2: Continue with sliding scale insulin.  History of gout: Continue with allopurinol.  COPD stable.  Hyperlipidemia continue with statins.  Tobacco abuse advised quitting.  Alcohol use monitor on CIWA protocol.  On thiamine    Estimated body mass index is 24.74 kg/m as calculated from the following:   Height as of this encounter: 5\' 9"  (1.753 m).   Weight as of this encounter: 76 kg.   DVT prophylaxis: Lovenox Code Status: DNR Family Communication: Disposition Plan: plan to discharge to SNF when insurance approved. Awaiting Cervical spine results.  Consultants:     Procedures:   None  Antimicrobials:  Ceftriaxone and azithromycin  Subjective: Sleepy, received ativan last night.    Objective: Vitals:   10/11/18 1017 10/11/18 1720 10/11/18 2312 10/12/18 1015  BP: (!) 135/100 137/83 (!) 153/92 (!) 156/77  Pulse: 79 79 66 65  Resp:  18 12   Temp:  98.1 F (36.7 C) 98.4 F (36.9 C)   TempSrc:  Oral Oral   SpO2: 95% 94% 95% 95%  Weight:      Height:        Intake/Output Summary (Last 24 hours) at 10/12/2018 1509 Last data filed at 10/12/2018 0645 Gross per 24 hour  Intake -  Output 800 ml  Net -800 ml   Filed  Weights   10/09/18 0530  Weight: 76 kg    Examination:  General exam: NAD Respiratory system: Crackles bases  Cardiovascular system: S 1, S 2 RRR Gastrointestinal system: BS present, soft, nt Central nervous system: alert, oriented, BL LE weakness more the left Extremities: no edema Skin: no rashes   Data Reviewed: I have personally reviewed following labs and imaging  studies  CBC: Recent Labs  Lab 10/08/18 2101 10/09/18 1025 10/10/18 0429 10/12/18 0753  WBC 18.7* 11.8* 8.8 7.2  NEUTROABS 16.0*  --   --   --   HGB 15.4 13.7 13.9 15.8  HCT 45.7 41.2 42.1 46.4  MCV 101.1* 102.0* 101.4* 100.2*  PLT 265 223 239 275   Basic Metabolic Panel: Recent Labs  Lab 10/08/18 2101 10/09/18 1025 10/10/18 0429 10/12/18 0753  NA 133* 140 140 136  K 4.2 3.8 3.8 4.2  CL 101 110 108 105  CO2 21* GLUCOSE 167* 133* 81 95  BUN 12 7* 9 11  CREATININE 1.06 0.84 0.74 0.69  CALCIUM 9.9 9.0 9.9 10.2  MG  --   --  1.9  --   PHOS  --   --  2.8  --    GFR: Estimated Creatinine Clearance: 78.6 mL/min (by C-G formula based on SCr of 0.69 mg/dL). Liver Function Tests: Recent Labs  Lab 10/08/18 2101  AST 17  ALT 12  ALKPHOS 68  BILITOT 0.6  PROT 6.3*  ALBUMIN 3.6   No results for input(s): LIPASE, AMYLASE in the last 168 hours. No results for input(s): AMMONIA in the last 168 hours. Coagulation Profile: No results for input(s): INR, PROTIME in the last 168 hours. Cardiac Enzymes: Recent Labs  Lab 10/09/18 1025  CKTOTAL 107   BNP (last 3 results) No results for input(s): PROBNP in the last 8760 hours. HbA1C: No results for input(s): HGBA1C in the last 72 hours. CBG: Recent Labs  Lab 10/11/18 1338 10/11/18 1721 10/11/18 2131 10/12/18 0739 10/12/18 1159  GLUCAP 157* 95 173* 96 116*   Lipid Profile: No results for input(s): CHOL, HDL, LDLCALC, TRIG, CHOLHDL, LDLDIRECT in the last 72 hours. Thyroid Function Tests: No results for input(s): TSH, T4TOTAL, FREET4, T3FREE, THYROIDAB in the last 72 hours. Anemia Panel: No results for input(s): VITAMINB12, FOLATE, FERRITIN, TIBC, IRON, RETICCTPCT in the last 72 hours. Sepsis Labs: Recent Labs  Lab 10/08/18 2101  LATICACIDVEN 1.6    Recent Results (from the past 240 hour(s))  Blood Culture (routine x 2)     Status: None (Preliminary result)   Collection Time: 10/09/18  1:55 AM    Specimen: BLOOD  Result Value Ref Range Status   Specimen Description BLOOD RIGHT ANTECUBITAL  Final   Special Requests   Final    BOTTLES DRAWN AEROBIC AND ANAEROBIC Blood Culture adequate volume   Culture   Final    NO GROWTH 3 DAYS Performed at [facility], [address] [place], [place] [address]    Report Status PENDING  Incomplete  SARS Coronavirus 2 (CEPHEID- Performed in [facility] hospital lab), Hosp Order     Status: None   Collection Time: 10/09/18  2:14 AM   Specimen: Nasopharyngeal Swab  Result Value Ref Range Status   SARS Coronavirus 2 NEGATIVE NEGATIVE Final    Comment: (NOTE) If result is NEGATIVE SARS-CoV-2 target nucleic acids are NOT DETECTED. The SARS-CoV-2 RNA is generally detectable in upper and lower  respiratory specimens during the acute phase of  infection. The lowest  concentration of SARS-CoV-2 viral copies this assay can detect is 250  copies / mL. A negative result does not preclude SARS-CoV-2 infection  and should not be used as the sole basis for treatment or other  patient management decisions.  A negative result may occur with  improper specimen collection / handling, submission of specimen other  than nasopharyngeal swab, presence of viral mutation(s) within the  areas targeted by this assay, and inadequate number of viral copies  (<250 copies / mL). A negative result must be combined with clinical  observations, patient history, and epidemiological information. If result is POSITIVE SARS-CoV-2 target nucleic acids are DETECTED. The SARS-CoV-2 RNA is generally detectable in upper and lower  respiratory specimens dur ing the acute phase of infection.  Positive  results are indicative of active infection with SARS-CoV-2.  Clinical  correlation with patient history and other diagnostic information is  necessary to determine patient infection status.  Positive results do  not rule out bacterial infection or co-infection with other viruses.  If result is PRESUMPTIVE POSTIVE SARS-CoV-2 nucleic acids MAY BE PRESENT.   A presumptive positive result was obtained on the submitted specimen  and confirmed on repeat testing.  While 2019 novel coronavirus  (SARS-CoV-2) nucleic acids may be present in the submitted sample  additional confirmatory testing may be necessary for epidemiological  and / or clinical management purposes  to differentiate between  SARS-CoV-2 and other Sarbecovirus currently known to infect humans.  If clinically indicated additional testing with an alternate test  methodology [phone]) is advised. The SARS-CoV-2 RNA is generally  detectable in upper and lower respiratory sp ecimens during the acute  phase of infection. The expected result is Negative. Fact Sheet for Patients:  StrictlyIdeas.no Fact Sheet for Healthcare Providers: BankingDealers.co.za This test is not yet approved or cleared by the [place] FDA and has been authorized for detection and/or diagnosis of SARS-CoV-2 by FDA under an Emergency Use Authorization (EUA).  This EUA will remain in effect (meaning this test can be used) for the duration of the COVID-19 declaration under Section 564(b)(1) of the Act, 21 U.S.C. section 360bbb-3(b)(1), unless the authorization is terminated or revoked sooner. Performed at [facility], [place] [address] [place], [place] [address]   Blood Culture (routine x 2)     Status: None (Preliminary result)   Collection Time: 10/09/18  2:14 AM   Specimen: BLOOD LEFT WRIST  Result Value Ref Range Status   Specimen Description BLOOD LEFT WRIST  Final   Special Requests   Final    BOTTLES DRAWN AEROBIC AND ANAEROBIC Blood Culture adequate volume   Culture   Final    NO GROWTH 3 DAYS Performed at [facility], [place] [address], [place], [place] [address]    Report Status PENDING  Incomplete  Novel Coronavirus, NAA (hospital order; send-out to ref lab)      Status: None   Collection Time: 10/10/18  6:20 PM   Specimen: Nasopharyngeal Swab; Respiratory  Result Value Ref Range Status   SARS-CoV-2, NAA NOT DETECTED NOT DETECTED Final    Comment: (NOTE) This test was developed and its performance characteristics determined by [employer]. This test has not been FDA cleared or approved. This test has been authorized by FDA under an Emergency Use Authorization (EUA). This test is only authorized for the duration of time the declaration that circumstances exist justifying the authorization of the emergency use of in vitro diagnostic tests for  detection of SARS-CoV-2 virus and/or diagnosis of COVID-19 infection under section 564(b)(1) of the Act, 21 U.S.C. 295MWU-1(L)(2360bbb-3(b)(1), unless the authorization is terminated or revoked sooner. When diagnostic testing is negative, the possibility of a false negative result should be considered in the context of a patient's recent exposures and the presence of clinical signs and symptoms consistent with COVID-19. An individual without symptoms of COVID-19 and who is not shedding SARS-CoV-2 virus would expect to have a negative (not detected) result in this assay. Performed  At: [facility] [address] [place], [place] [id number] Jolene SchimkeNagendra Sanjai MD DG:[id number]:[phone]    Coronavirus Source NASOPHARYNGEAL  Final    Comment: Performed at [facility], [address] [place], [place] [address]         Radiology Studies: Mr Cervical Spine W Wo Contrast  Result Date: 10/11/2018 CLINICAL DATA:  Larey SeatFell at home.  Worsening progressive weakness. EXAM: MRI CERVICAL SPINE WITHOUT AND WITH CONTRAST TECHNIQUE: Multiplanar and multiecho pulse sequences of the cervical spine, to include the craniocervical junction and cervicothoracic junction, were obtained without and with intravenous contrast. CONTRAST:  7 cc Gadavist COMPARISON:  CT 10/09/2018 FINDINGS: Alignment: Normal Vertebrae: Previous ACDF at  C3-4 and from C5 through C7 with solid union. Cord: No primary cord lesion.  See below regarding stenosis at C4-5. Posterior Fossa, vertebral arteries, paraspinal tissues: Negative Disc levels: Foramen magnum is widely patent. Osteoarthritis of the C1-2 articulation with mild narrowing of the spinal canal but no compression of the cord. Ample subarachnoid space surrounds the cord. C2-3: Spondylosis with endplate osteophytes and mild bulging of the disc. Bilateral facet osteoarthritis. Canal narrowing with effacement of the subarachnoid space surrounding the cord but no compression of the cord. Bilateral foraminal narrowing could affect either C3 nerve. C3-4: Distant ACDF has a good appearance with wide patency of the canal and foramina. C4-5: Spondylosis with endplate osteophytes and protruding disc material. Bilateral facet arthropathy and hypertrophy. Severe spinal stenosis with AP diameter of the canal only 4.5 mm, effacing the subarachnoid space and deforming the cord. Early abnormal T2 signal within the cord. Bilateral foraminal stenosis at this level. C5 through C7: Previous ACDF with wide patency of the canal and foramina. C7-T1: Facet osteoarthritis with 1 mm of anterolisthesis. No canal stenosis. Foraminal narrowing on the right because of osteophytic encroachment could possibly affect the right C8 nerve. Left foramen appears widely patent. IMPRESSION: Previous ACDF at C3-4 and from C5 through C7 with solid union and wide patency of the canal and foramina at those levels. Severe spinal stenosis at the mobile C4-5 level with AP diameter of the canal only 4.5 mm. Endplate osteophytes and protruding disc material. Facet and ligamentous hypertrophy. Flattening of the cord with some T2 signal in the cord. Bilateral foraminal stenosis could affect either C5 nerve. C2-3: Spondylosis and facet osteoarthritis with mild canal narrowing but no cord compression. Bilateral foraminal narrowing could affect either C3  nerve. C1-2: Osteoarthritis with canal narrowing but no cord compression C7-T1: Facet osteoarthritis with 1 mm of anterolisthesis. Foraminal narrowing on the right could possibly affect the right C8 nerve. No compressive central canal stenosis. Electronically Signed   By: Paulina FusiMark  Shogry M.D.   On: 10/11/2018 14:03        Scheduled Meds: . allopurinol  200 mg Oral Daily  . amLODipine  5 mg Oral Daily  . aspirin EC  81 mg Oral Daily  . azithromycin  500 mg Oral q1800  . enoxaparin (LOVENOX) injection  40 mg  Subcutaneous Daily  . folic acid  1 mg Oral Daily  . gabapentin  300 mg Oral Daily  . gabapentin  600 mg Oral QHS  . insulin aspart  0-9 Units Subcutaneous TID WC  . lisinopril  20 mg Oral Daily  . multivitamin with minerals  1 tablet Oral Daily  . pravastatin  20 mg Oral q1800  . thiamine  100 mg Oral Daily   Continuous Infusions: . cefTRIAXone (ROCEPHIN)  IV Stopped (10/12/18 0739)     LOS: 3 days    Time spent: 35 minutes.     Alba CoryBelkys A Trenda Corliss, MD [facility] Pager [phone]  If 7PM-7AM, please contact night-coverage [email or web address] Password [email or web address] 10/12/2018, 3:09 PM

## 2018-10-12 NOTE — Progress Notes (Signed)
Occupational Therapy Treatment Patient Details Name: Keith Wise MRN: [id number] DOB: 1941-01-02 Today's Date: 10/12/2018    History of present illness 78 year old with past medical history of COPD with ongoing tobacco abuse, hypertension, previous history of seizure as a childhood and diabetes type 2 who was brought to the ER after patient had a fall at home.  Per family patient has been getting progressively weaker over the last 6 months has become partially bedbound.  He has some numbness in his upper extremity and had difficulty relating and PCP has sent him to physical therapy initially.  Patient has been having difficulty with ambulation since December.   OT comments  Pt progressing with self feeding. Pt requiring assist for feeding task with 50% accuracy as pt incoordination and weakness leave pt at a disadvantage even with built up utensils. Pt supervisionA to minA for task due to decreased strength with continued use of RUE. Pt denying need to mobilize. Pt education provided on need for bed mobility to reduce bed sores and rapid muscle weakness. Pt continued to refuse mobilization. Pt totalA to sit up towards HOB due to pain "all over" with and mobility. Pt requires continued OT. Pt following acutely.    Follow Up Recommendations  SNF;Supervision/Assistance - 24 hour    Equipment Recommendations  Hospital bed    Recommendations for Other Services      Precautions / Restrictions Precautions Precautions: Fall Restrictions Weight Bearing Restrictions: No       Mobility Bed Mobility Overal bed mobility: Needs Assistance             General bed mobility comments: pt denying need to mobilize  Transfers Overall transfer level: Needs assistance               General transfer comment: pt denying need to mobilize    Balance                                           ADL either performed or assessed with clinical judgement   ADL Overall ADL's  : Needs assistance/impaired Eating/Feeding: Set up;Minimal assistance;Bed level;With adaptive utensils Eating/Feeding Details (indicate cue type and reason): assist with built up utensils Grooming: Set up;Bed level;Wash/dry face Grooming Details (indicate cue type and reason): flexing/extending elbow                              Functional mobility during ADLs: Total assistance General ADL Comments: Pt increased need for MinA for UB ADL and LB nearly totalA due to weakness      Vision   Vision Assessment?: No apparent visual deficits   Perception     Praxis      Cognition Arousal/Alertness: Awake/alert Behavior During Therapy: Flat affect Overall Cognitive Status: No family/caregiver present to determine baseline cognitive functioning                                          Exercises     Shoulder Instructions       General Comments Pt requiring assist for feeding task as pt incoordination and weakness leave pt at a disadvantage even with built up utensils.    Pertinent Vitals/ Pain       Pain Assessment: Faces Faces  Pain Scale: Hurts little more Pain Location: "everywhere" Pain Descriptors / Indicators: Discomfort Pain Intervention(s): Limited activity within patient's tolerance  Home Living                                          Prior Functioning/Environment              Frequency  Min 2X/week        Progress Toward Goals  OT Goals(current goals can now be found in the care plan section)  Progress towards OT goals: Progressing toward goals  Acute Rehab OT Goals Patient Stated Goal: get stronger OT Goal Formulation: With patient Time For Goal Achievement: 10/23/18 Potential to Achieve Goals: Good ADL Goals Pt Will Perform Grooming: with set-up;sitting Pt Will Perform Upper Body Dressing: with set-up;sitting Pt Will Perform Lower Body Dressing: with mod assist;sitting/lateral leans Pt Will Perform  Toileting - Clothing Manipulation and hygiene: with mod assist;sitting/lateral leans Pt/caregiver will Perform Home Exercise Program: Both right and left upper extremity;With written HEP provided;Right Upper extremity;Left upper extremity Additional ADL Goal #1: Pt will sit EOB for ADL x10 mins with fair balance in prep for ADL.  Plan Discharge plan remains appropriate    Co-evaluation                 AM-PAC OT "6 Clicks" Daily Activity     Outcome Measure   Help from another person eating meals?: A Little Help from another person taking care of personal grooming?: A Little Help from another person toileting, which includes using toliet, bedpan, or urinal?: Total Help from another person bathing (including washing, rinsing, drying)?: Total Help from another person to put on and taking off regular upper body clothing?: A Lot Help from another person to put on and taking off regular lower body clothing?: Total 6 Click Score: 11    End of Session    OT Visit Diagnosis: Unsteadiness on feet (R26.81);Muscle weakness (generalized) (M62.81)   Activity Tolerance Patient limited by pain   Patient Left in bed;with call bell/phone within reach;with bed alarm set   Nurse Communication Mobility status        Time: 1213-1229 OT Time Calculation (min): 16 min  Charges: OT General Charges $OT Visit: 1 Visit OT Treatments $Self Care/Home Management : 8-22 mins  Cristi LoronAllison (Jelenek) [place] OTR/L Acute Rehabilitation Services Pager: [phone] Office: [phone]    Keith Wise 10/12/2018, 3:48 PM

## 2018-10-13 LAB — GLUCOSE, CAPILLARY: Glucose-Capillary: 102 mg/dL — ABNORMAL HIGH (ref 70–99)

## 2018-10-13 MED ORDER — FOLIC ACID 1 MG PO TABS: 1.0000 mg | ORAL_TABLET | Freq: Every day | ORAL | 0 refills | Status: AC

## 2018-10-13 MED ORDER — AMOXICILLIN-POT CLAVULANATE 875-125 MG PO TABS: 1.0000 | ORAL_TABLET | Freq: Two times a day (BID) | ORAL | 0 refills | Status: AC

## 2018-10-13 MED ORDER — AZITHROMYCIN 500 MG PO TABS: 500.0000 mg | ORAL_TABLET | Freq: Every day | ORAL | 0 refills | Status: AC

## 2018-10-13 MED ORDER — LISINOPRIL 20 MG PO TABS: 20.0000 mg | ORAL_TABLET | Freq: Every day | ORAL | 0 refills | Status: AC

## 2018-10-13 MED ORDER — AMLODIPINE BESYLATE 5 MG PO TABS: 5.0000 mg | ORAL_TABLET | Freq: Every day | ORAL | 0 refills | Status: AC

## 2018-10-13 MED ORDER — THIAMINE HCL 100 MG PO TABS: 100.0000 mg | ORAL_TABLET | Freq: Every day | ORAL | 0 refills | Status: AC

## 2018-10-13 NOTE — TOC Transition Note (Signed)
Transition of Care [facility]) - CM/SW Discharge Note   Patient Details  Name: MADDYX WIECK MRN: [id number] Date of Birth: Nov 07, 1940  [facility]) CM/SW Contact:  Zenon Mayo, RN Phone Number: 10/13/2018, 11:38 AM   Clinical Narrative:    NCM received confirmation from [facility] with [facility] , she has the auth and patient can come today if ready,  Per MD patient is ready for dc.  NCM contacted wife to let her know and [place] with [facility].  He will go to bed 101P, RN to call report to [phone].  NCM will call ptar to set up transport for 1 pm.   Final next level of care: [place] Barriers to Discharge: No Barriers Identified   Patient Goals and CMS Choice Patient states their goals for this hospitalization and ongoing recovery are:: get better CMS Medicare.gov Compare Post Acute Care list provided to:: Patient Choice offered to / list presented to : Patient  Discharge Placement PASRR number recieved: 10/09/18            Patient chooses bed at: [facility] Patient to be transferred to facility by: ptar Name of family member notified: Mrs Nixon Patient and family notified of of transfer: 10/13/18  Discharge Plan and Services   Discharge Planning Services: CM Consult Post Acute Care Choice: [place]          DME Arranged: (NA)         HH Arranged: NA          Social Determinants of Health (SDOH) Interventions     Readmission Risk Interventions No flowsheet data found.

## 2018-10-13 NOTE — Care Management Important Message (Signed)
Important Message  Patient Details  Name: Keith Wise MRN: [id number] Date of Birth: Oct 29, 1940   Medicare Important Message Given:  Yes     Orbie Pyo 10/13/2018, 2:02 PM

## 2018-10-13 NOTE — Progress Notes (Signed)
PT Cancellation Note  Patient Details Name: Keith Wise MRN: [id number] DOB: 1940/05/16   Cancelled Treatment:    Reason Eval/Treat Not Completed: Patient declined, no reason specified. Pt adamantly refusing any mobility or exercises. Pt reports he is happy just staying in bed and does not want to move at all. When explained we could help him get more mobile pt continued to decline. When asked if he is okay with staying in bed the rest of his life he said yes. Will continue attempts.   [place] 10/13/2018, 9:35 AM [place] Pager [phone] Office [phone]

## 2018-10-13 NOTE — Discharge Summary (Signed)
Physician Discharge Summary  JULIUS MATUS ZOX:[id number] DOB: [birth year] DOA: 10/08/2018  PCP: Kaleen Mask, MD  Admit date: 10/08/2018 Discharge date: 10/13/2018  Admitted From: Home  Disposition: SNF  Recommendations for Outpatient Follow-up:  1. Follow up with PCP in 1-2 weeks 2. Please obtain BMP/CBC in one week 3. Follow up with DR Wynetta Emery for  Sx. Due to cord compression    Discharge Condition: Stable.  CODE STATUS: full code Diet recommendation: Heart Healthy  Brief/Interim Summary: 78 year old with past medical history of COPD with ongoing tobacco abuse, hypertension, previous history of seizure as a childhood and diabetes type 2 who was brought to the ER after patient had a fall at home. Per family patient has been getting progressively weaker over the last 6 months has become partially bedbound. He has some numbness in his upper extremity and had difficulty relating and PCP has sent him to physical therapy initially. Patient has been having difficulty with ambulation since December.  Evaluation in the ED patient was found to be tachycardic temperature 100, leukocytosis, chest x-ray showing infiltrates concerning for pneumonia.  COVID test negative.   1-Community-acquired pneumonia, SIRS; Patient presents with fever, chest x-ray with infiltrates, leukocytosis. COVID  test negative. Continue with IV antibiotics, ceftriaxone and azithromycin  Follow blood cultures; no growth to date  Strep pneumonia antigen negative Day 5 antibiotics. Would treat for total of 10 days. discharge on azithro for 4 days and augmentin for 5 days.  Stable for discharge   2-Frequent fall, progressive weakness over the last 6 months: CT head and C-spine ; no acute intracranial or cervical spine finding.  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 Degenerative disease which could cause mild cord impingement at C1-2 and C2-3. 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 Started  aspirin and  ECHO normal EF.   MRI cervical spine ; with multiples finding cord compression C 4--5. Neurosurgery consulted.  If Neurosurgery consider patient is neurologically stable and procedure can be delayed 1 week to document resolution of pneumonia, in the events that implants are needed, I agree with waiting 1 week for procedure.   Hypertension: Continue lisinopril.  Add PRN hydralazine. Start Norvasc for better blood pressure controlled.   Diabetes type 2: Continue with sliding scale insulin.  History of gout: Continue with allopurinol.  COPD stable.  Hyperlipidemia continue with statins.  Tobacco abuse advised quitting.  Alcohol use monitor on CIWA protocol.  On thiamine     Discharge Diagnoses:  Principal Problem:   SIRS (systemic inflammatory response syndrome) (HCC) Active Problems:   Hypertension   Hyperlipidemia   COPD (chronic obstructive pulmonary disease) with chronic bronchitis (HCC)   Gout   Lower extremity weakness   Community acquired pneumonia of left lower lobe of lung [facility])    Discharge Instructions  Discharge Instructions    Diet - low sodium heart healthy   Complete by: As directed    Increase activity slowly   Complete by: As directed      Allergies as of 10/13/2018   No Known Allergies     Medication List    STOP taking these medications   colchicine 0.6 MG tablet   HYDROcodone-acetaminophen 5-325 MG tablet Commonly known as: Norco   ibuprofen 200 MG tablet Commonly known as: ADVIL   methocarbamol 500 MG tablet Commonly known as: ROBAXIN   ondansetron 4 MG tablet Commonly known as: Zofran   PHENobarbital 97.2 MG tablet Commonly known  as: LUMINAL     TAKE these medications   allopurinol 100 MG tablet Commonly known as: ZYLOPRIM TAKE 1 TABLET BY MOUTH EVERY DAY What changed: how much to take   amLODipine 5 MG tablet Commonly known as: NORVASC Take 1 tablet (5 mg total) by  mouth daily. Start taking on: October 14, 2018   amoxicillin-clavulanate 875-125 MG tablet Commonly known as: Augmentin Take 1 tablet by mouth 2 (two) times daily for 5 days.   aspirin EC 325 MG tablet Take 1 tablet (325 mg total) by mouth daily.   azithromycin 500 MG tablet Commonly known as: ZITHROMAX Take 1 tablet (500 mg total) by mouth daily for 4 days.   docusate sodium 100 MG capsule Commonly known as: Colace Take 1 capsule (100 mg total) by mouth 2 (two) times daily. Continue this while taking narcotics to help with bowel movements   folic acid 1 MG tablet Commonly known as: FOLVITE Take 1 tablet (1 mg total) by mouth daily. Start taking on: October 14, 2018   gabapentin 300 MG capsule Commonly known as: NEURONTIN Take 300-600 mg by mouth See admin instructions. Take 1 capsule (300mg ) in the morning, and 2 capsules (600mg ) every night   glimepiride 2 MG tablet Commonly known as: AMARYL TAKE 1 TABLET BY MOUTH EVERY DAY What changed: when to take this   lisinopril 20 MG tablet Commonly known as: ZESTRIL Take 1 tablet (20 mg total) by mouth daily. Start taking on: October 14, 2018 What changed:   medication strength  how much to take   lovastatin 20 MG tablet Commonly known as: MEVACOR Take 20 mg by mouth at bedtime.   meloxicam 7.5 MG tablet Commonly known as: MOBIC Take 7.5 mg by mouth daily as needed for pain.   METAMUCIL FIBER PO Take 2 tablets by mouth daily.   metFORMIN 1000 MG tablet Commonly known as: GLUCOPHAGE Take 1,000 mg by mouth at bedtime.   thiamine 100 MG tablet Take 1 tablet (100 mg total) by mouth daily. Start taking on: October 14, 2018   vitamin B-12 1000 MCG tablet Commonly known as: CYANOCOBALAMIN Take 1,000 mcg by mouth daily.   Vitamin D-3 125 MCG (5000 UT) Tabs Take 5,000 Units by mouth daily.       No Known Allergies  Consultations: Dr Wynetta Emeryram  Procedures/Studies: Ct Head Wo Contrast  Result Date: 10/09/2018 CLINICAL  DATA:  Multiple falls with progressive weakness for several months EXAM: CT HEAD WITHOUT CONTRAST CT CERVICAL SPINE WITHOUT CONTRAST TECHNIQUE: Multidetector CT imaging of the head and cervical spine was performed following the standard protocol without intravenous contrast. Multiplanar CT image reconstructions of the cervical spine were also generated. COMPARISON:  Cervical radiography 01/18/2010 FINDINGS: CT HEAD FINDINGS Brain: No evidence of acute infarction, hemorrhage, hydrocephalus, extra-axial collection or mass lesion/mass effect. Extensive low-density in the cerebral white matter attributed to chronic small vessel ischemia. Small remote left occipital cortex infarct. Vascular: Atherosclerotic calcification. Skull: Negative for fracture or erosion. Sinuses/Orbits: Negative CT CERVICAL SPINE FINDINGS Alignment: Normal alignment Skull base and vertebrae: Negative for acute fracture. Soft tissues and spinal canal: No prevertebral fluid or swelling. No visible canal hematoma. Disc levels: Prominent ligamentous thickening and mineralization about the dens with possible mild mass effect on the cervicomedullary junction. No superimposed acute erosion in the dens. C2-3 advanced disc degeneration with biforaminal stenosis. There may also be cord impingement at this level. C3-4 ACDF with solid arthrodesis. C4-5 advanced disc degeneration with spinal stenosis and biforaminal impingement C5-C7  ACDF with solid arthrodesis. Upper chest: Emphysema. IMPRESSION: 1. No acute intracranial or cervical spine finding. 2. 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 3. Degenerative disease which could cause mild cord impingement at C1-2 and C2-3. Electronically Signed   By: Marnee Spring M.D.   On: 10/09/2018 05:00   Ct Cervical Spine Wo Contrast  Result Date: 10/09/2018 CLINICAL DATA:  Multiple falls with progressive weakness for several months EXAM: CT HEAD WITHOUT CONTRAST CT CERVICAL SPINE WITHOUT CONTRAST  TECHNIQUE: Multidetector CT imaging of the head and cervical spine was performed following the standard protocol without intravenous contrast. Multiplanar CT image reconstructions of the cervical spine were also generated. COMPARISON:  Cervical radiography 01/18/2010 FINDINGS: CT HEAD FINDINGS Brain: No evidence of acute infarction, hemorrhage, hydrocephalus, extra-axial collection or mass lesion/mass effect. Extensive low-density in the cerebral white matter attributed to chronic small vessel ischemia. Small remote left occipital cortex infarct. Vascular: Atherosclerotic calcification. Skull: Negative for fracture or erosion. Sinuses/Orbits: Negative CT CERVICAL SPINE FINDINGS Alignment: Normal alignment Skull base and vertebrae: Negative for acute fracture. Soft tissues and spinal canal: No prevertebral fluid or swelling. No visible canal hematoma. Disc levels: Prominent ligamentous thickening and mineralization about the dens with possible mild mass effect on the cervicomedullary junction. No superimposed acute erosion in the dens. C2-3 advanced disc degeneration with biforaminal stenosis. There may also be cord impingement at this level. C3-4 ACDF with solid arthrodesis. C4-5 advanced disc degeneration with spinal stenosis and biforaminal impingement C5-C7 ACDF with solid arthrodesis. Upper chest: Emphysema. IMPRESSION: 1. No acute intracranial or cervical spine finding. 2. 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 3. Degenerative disease which could cause mild cord impingement at C1-2 and C2-3. Electronically Signed   By: Marnee Spring M.D.   On: 10/09/2018 05:00   Mr Brain Wo Contrast  Result Date: 10/09/2018 CLINICAL DATA:  Initial evaluation for acute headache status post head trauma, progressive weakness over past 6 months. EXAM: MRI HEAD WITHOUT CONTRAST TECHNIQUE: Multiplanar, multiecho pulse sequences of the brain and surrounding structures were obtained without intravenous  contrast. COMPARISON:  Prior CT from earlier the same day. FINDINGS: Brain: Diffuse prominence of the CSF containing spaces compatible with generalized age-related cerebral atrophy. Patchy and confluent T2/FLAIR hyperintensity within the periventricular and deep white matter both cerebral hemispheres most consistent with chronic small vessel ischemic disease, fairly advanced in nature. Chronic microvascular ischemic changes present within the pons. Few scattered superimposed remote lacunar infarcts present within the bilateral thalami. Probable small remote left occipital cortical infarct noted. No abnormal foci of restricted diffusion to suggest acute or subacute ischemia. Gray-white matter differentiation maintained. No other areas of remote cortical infarction. No foci of susceptibility artifact to suggest acute or chronic intracranial hemorrhage. No mass lesion, midline shift or mass effect. No hydrocephalus. No extra-axial fluid collection. Pituitary gland suprasellar region normal. Midline structures intact. Vascular: Major intracranial vascular flow voids are maintained. Mild arterial dolichoectasia noted about the circle-of-Willis. Skull and upper cervical spine: Craniocervical junction within normal limits. Postsurgical changes partially visualize within the upper cervical spine. Bone marrow signal intensity normal. No scalp soft tissue abnormality. Sinuses/Orbits: Globes and orbital soft tissues within normal limits. Paranasal sinuses are largely clear. Small right mastoid effusion noted, of doubtful significance. Other: None. IMPRESSION: 1. No acute intracranial abnormality. 2. Age-related cerebral atrophy with advanced chronic microvascular ischemic disease involving the supratentorial cerebral white matter and pons. 3. Superimposed small remote left occipital cortical infarct. Electronically Signed   By: Janell Quiet.D.  On: 10/09/2018 19:22   Mr Cervical Spine W Wo Contrast  Result Date:  10/11/2018 CLINICAL DATA:  Larey SeatFell at home.  Worsening progressive weakness. EXAM: MRI CERVICAL SPINE WITHOUT AND WITH CONTRAST TECHNIQUE: Multiplanar and multiecho pulse sequences of the cervical spine, to include the craniocervical junction and cervicothoracic junction, were obtained without and with intravenous contrast. CONTRAST:  7 cc Gadavist COMPARISON:  CT 10/09/2018 FINDINGS: Alignment: Normal Vertebrae: Previous ACDF at C3-4 and from C5 through C7 with solid union. Cord: No primary cord lesion.  See below regarding stenosis at C4-5. Posterior Fossa, vertebral arteries, paraspinal tissues: Negative Disc levels: Foramen magnum is widely patent. Osteoarthritis of the C1-2 articulation with mild narrowing of the spinal canal but no compression of the cord. Ample subarachnoid space surrounds the cord. C2-3: Spondylosis with endplate osteophytes and mild bulging of the disc. Bilateral facet osteoarthritis. Canal narrowing with effacement of the subarachnoid space surrounding the cord but no compression of the cord. Bilateral foraminal narrowing could affect either C3 nerve. C3-4: Distant ACDF has a good appearance with wide patency of the canal and foramina. C4-5: Spondylosis with endplate osteophytes and protruding disc material. Bilateral facet arthropathy and hypertrophy. Severe spinal stenosis with AP diameter of the canal only 4.5 mm, effacing the subarachnoid space and deforming the cord. Early abnormal T2 signal within the cord. Bilateral foraminal stenosis at this level. C5 through C7: Previous ACDF with wide patency of the canal and foramina. C7-T1: Facet osteoarthritis with 1 mm of anterolisthesis. No canal stenosis. Foraminal narrowing on the right because of osteophytic encroachment could possibly affect the right C8 nerve. Left foramen appears widely patent. IMPRESSION: Previous ACDF at C3-4 and from C5 through C7 with solid union and wide patency of the canal and foramina at those levels. Severe  spinal stenosis at the mobile C4-5 level with AP diameter of the canal only 4.5 mm. Endplate osteophytes and protruding disc material. Facet and ligamentous hypertrophy. Flattening of the cord with some T2 signal in the cord. Bilateral foraminal stenosis could affect either C5 nerve. C2-3: Spondylosis and facet osteoarthritis with mild canal narrowing but no cord compression. Bilateral foraminal narrowing could affect either C3 nerve. C1-2: Osteoarthritis with canal narrowing but no cord compression C7-T1: Facet osteoarthritis with 1 mm of anterolisthesis. Foraminal narrowing on the right could possibly affect the right C8 nerve. No compressive central canal stenosis. Electronically Signed   By: Paulina FusiMark  Shogry M.D.   On: 10/11/2018 14:03   Dg Pelvis Portable  Result Date: 10/09/2018 CLINICAL DATA:  Pelvic pain and tenderness post fall last night. EXAM: PORTABLE PELVIS 1-2 VIEWS COMPARISON:  11/01/2013 FINDINGS: Diffuse decreased bone mineralization. Left hip arthroplasty intact and unchanged. Mild symmetric degenerative change of the hips. No acute fracture or dislocation. Mild degenerate change of the spine and sacroiliac joints. IMPRESSION: No acute findings. Mild degenerative change of the hips.  Stable left hip arthroplasty. Electronically Signed   By: Elberta Fortisaniel  Boyle M.D.   On: 10/09/2018 07:31   Dg Chest Port 1 View  Result Date: 10/09/2018 CLINICAL DATA:  Cough EXAM: PORTABLE CHEST 1 VIEW COMPARISON:  10/31/2013 FINDINGS: Postsurgical changes in the cervical spine. Right lung is clear. Airspace disease at the left base. Normal heart size. Aortic atherosclerosis. No pneumothorax. IMPRESSION: Patchy airspace disease at the left base, suspect for pneumonia. Electronically Signed   By: Jasmine PangKim  Fujinaga M.D.   On: 10/09/2018 01:27      Subjective: He is feeling better.  Cough improved  Discharge Exam: Vitals:  10/13/18 0004 10/13/18 0717  BP: (!) 145/110 (!) 134/92  Pulse:  79  Resp:  18  Temp:   98.2 F (36.8 C)  SpO2:  92%     General: Pt is alert, awake, not in acute distress Cardiovascular: RRR, S1/S2 +, no rubs, no gallops Respiratory: CTA bilaterally, no wheezing, no rhonchi Abdominal: Soft, NT, ND, bowel sounds + Extremities: no edema, no cyanosis    The results of significant diagnostics from this hospitalization (including imaging, microbiology, ancillary and laboratory) are listed below for reference.     Microbiology: Recent Results (from the past 240 hour(s))  Blood Culture (routine x 2)     Status: None (Preliminary result)   Collection Time: 10/09/18  1:55 AM   Specimen: BLOOD  Result Value Ref Range Status   Specimen Description BLOOD RIGHT ANTECUBITAL  Final   Special Requests   Final    BOTTLES DRAWN AEROBIC AND ANAEROBIC Blood Culture adequate volume   Culture   Final    NO GROWTH 4 DAYS Performed at [facility], [address], [place], [place] [address]    Report Status PENDING  Incomplete  SARS Coronavirus 2 (CEPHEID- Performed in [place] [facility] lab), Hosp Order     Status: None   Collection Time: 10/09/18  2:14 AM   Specimen: Nasopharyngeal Swab  Result Value Ref Range Status   SARS Coronavirus 2 NEGATIVE NEGATIVE Final    Comment: (NOTE) If result is NEGATIVE SARS-CoV-2 target nucleic acids are NOT DETECTED. The SARS-CoV-2 RNA is generally detectable in upper and lower  respiratory specimens during the acute phase of infection. The lowest  concentration of SARS-CoV-2 viral copies this assay can detect is 250  copies / mL. A negative result does not preclude SARS-CoV-2 infection  and should not be used as the sole basis for treatment or other  patient management decisions.  A negative result may occur with  improper specimen collection / handling, submission of specimen other  than nasopharyngeal swab, presence of viral mutation(s) within the  areas targeted by this assay, and inadequate number of viral copies  (<250 copies /  mL). A negative result must be combined with clinical  observations, patient history, and epidemiological information. If result is POSITIVE SARS-CoV-2 target nucleic acids are DETECTED. The SARS-CoV-2 RNA is generally detectable in upper and lower  respiratory specimens dur ing the acute phase of infection.  Positive  results are indicative of active infection with SARS-CoV-2.  Clinical  correlation with patient history and other diagnostic information is  necessary to determine patient infection status.  Positive results do  not rule out bacterial infection or co-infection with other viruses. If result is PRESUMPTIVE POSTIVE SARS-CoV-2 nucleic acids MAY BE PRESENT.   A presumptive positive result was obtained on the submitted specimen  and confirmed on repeat testing.  While 2019 novel coronavirus  (SARS-CoV-2) nucleic acids may be present in the submitted sample  additional confirmatory testing may be necessary for epidemiological  and / or clinical management purposes  to differentiate between  SARS-CoV-2 and other Sarbecovirus currently known to infect humans.  If clinically indicated additional testing with an alternate test  methodology [phone]) is advised. The SARS-CoV-2 RNA is generally  detectable in upper and lower respiratory sp ecimens during the acute  phase of infection. The expected result is Negative. Fact Sheet for Patients:  StrictlyIdeas.no Fact Sheet for Healthcare Providers: BankingDealers.co.za This test is not yet approved or cleared by the [place] FDA and  has been authorized for detection and/or diagnosis of SARS-CoV-2 by FDA under an Emergency Use Authorization (EUA).  This EUA will remain in effect (meaning this test can be used) for the duration of the COVID-19 declaration under Section 564(b)(1) of the Act, 21 U.S.C. section 360bbb-3(b)(1), unless the authorization is terminated or revoked  sooner. Performed at [facility], [address], [place], [place] [address]   Blood Culture (routine x 2)     Status: None (Preliminary result)   Collection Time: 10/09/18  2:14 AM   Specimen: BLOOD LEFT WRIST  Result Value Ref Range Status   Specimen Description BLOOD LEFT WRIST  Final   Special Requests   Final    BOTTLES DRAWN AEROBIC AND ANAEROBIC Blood Culture adequate volume   Culture   Final    NO GROWTH 4 DAYS Performed at [facility], [address], [place], [place] [address]    Report Status PENDING  Incomplete  Novel Coronavirus, NAA (hospital order; send-out to ref lab)     Status: None   Collection Time: 10/10/18  6:20 PM   Specimen: Nasopharyngeal Swab; Respiratory  Result Value Ref Range Status   SARS-CoV-2, NAA NOT DETECTED NOT DETECTED Final    Comment: (NOTE) This test was developed and its performance characteristics determined by [employer]. This test has not been FDA cleared or approved. This test has been authorized by FDA under an Emergency Use Authorization (EUA). This test is only authorized for the duration of time the declaration that circumstances exist justifying the authorization of the emergency use of in vitro diagnostic tests for detection of SARS-CoV-2 virus and/or diagnosis of COVID-19 infection under section 564(b)(1) of the Act, 21 U.S.C. 098JXB-1(Y)(7), unless the authorization is terminated or revoked sooner. When diagnostic testing is negative, the possibility of a false negative result should be considered in the context of a patient's recent exposures and the presence of clinical signs and symptoms consistent with COVID-19. An individual without symptoms of COVID-19 and who is not shedding SARS-CoV-2 virus would expect to have a negative (not detected) result in this assay. Performed  At: [facility] [address] [place], [place] [id number] Jolene Schimke MD QM:[id number]    Coronavirus Source  NASOPHARYNGEAL  Final    Comment: Performed at [facility], [address] [place], [place] [address]     Labs: BNP (last 3 results) No results for input(s): BNP in the last 8760 hours. Basic Metabolic Panel: Recent Labs  Lab 10/08/18 2101 10/09/18 1025 10/10/18 0429 10/12/18 0753  NA 133* 140 140 136  K 4.2 3.8 3.8 4.2  CL 101 110 108 105  CO2 21* GLUCOSE 167* 133* 81 95  BUN 12 7* 9 11  CREATININE 1.06 0.84 0.74 0.69  CALCIUM 9.9 9.0 9.9 10.2  MG  --   --  1.9  --   PHOS  --   --  2.8  --    Liver Function Tests: Recent Labs  Lab 10/08/18 2101  AST 17  ALT 12  ALKPHOS 68  BILITOT 0.6  PROT 6.3*  ALBUMIN 3.6   No results for input(s): LIPASE, AMYLASE in the last 168 hours. No results for input(s): AMMONIA in the last 168 hours. CBC: Recent Labs  Lab 10/08/18 2101 10/09/18 1025 10/10/18 0429 10/12/18 0753  WBC 18.7* 11.8* 8.8 7.2  NEUTROABS 16.0*  --   --   --   HGB 15.4 13.7 13.9 15.8  HCT 45.7 41.2 42.1 46.4  MCV 101.1* 102.0* 101.4* 100.2*  PLT 265 223 239 275   Cardiac Enzymes: Recent Labs  Lab 10/09/18 1025  CKTOTAL 107   BNP: Invalid input(s): POCBNP CBG: Recent Labs  Lab 10/12/18 0739 10/12/18 1159 10/12/18 1633 10/12/18 2149 10/13/18 0758  GLUCAP 96 116* 121* 152* 102*   D-Dimer No results for input(s): DDIMER in the last 72 hours. Hgb A1c No results for input(s): HGBA1C in the last 72 hours. Lipid Profile No results for input(s): CHOL, HDL, LDLCALC, TRIG, CHOLHDL, LDLDIRECT in the last 72 hours. Thyroid function studies No results for input(s): TSH, T4TOTAL, T3FREE, THYROIDAB in the last 72 hours.  Invalid input(s): FREET3 Anemia work up No results for input(s): VITAMINB12, FOLATE, FERRITIN, TIBC, IRON, RETICCTPCT in the last 72 hours. Urinalysis    Component Value Date/Time   COLORURINE YELLOW 10/08/2018 2105   APPEARANCEUR CLEAR 10/08/2018 2105   LABSPEC 1.014 10/08/2018 2105   PHURINE 5.0 10/08/2018  2105   GLUCOSEU NEGATIVE 10/08/2018 2105   HGBUR NEGATIVE 10/08/2018 2105   BILIRUBINUR NEGATIVE 10/08/2018 2105   KETONESUR NEGATIVE 10/08/2018 2105   PROTEINUR NEGATIVE 10/08/2018 2105   UROBILINOGEN 1.0 11/01/2013 0023   NITRITE NEGATIVE 10/08/2018 2105   LEUKOCYTESUR NEGATIVE 10/08/2018 2105   Sepsis Labs Invalid input(s): PROCALCITONIN,  WBC,  LACTICIDVEN Microbiology Recent Results (from the past 240 hour(s))  Blood Culture (routine x 2)     Status: None (Preliminary result)   Collection Time: 10/09/18  1:55 AM   Specimen: BLOOD  Result Value Ref Range Status   Specimen Description BLOOD RIGHT ANTECUBITAL  Final   Special Requests   Final    BOTTLES DRAWN AEROBIC AND ANAEROBIC Blood Culture adequate volume   Culture   Final    NO GROWTH 4 DAYS Performed at [facility], [address] [place], [place] [address]    Report Status PENDING  Incomplete  SARS Coronavirus 2 (CEPHEID- Performed in [facility] hospital lab), Hosp Order     Status: None   Collection Time: 10/09/18  2:14 AM   Specimen: Nasopharyngeal Swab  Result Value Ref Range Status   SARS Coronavirus 2 NEGATIVE NEGATIVE Final    Comment: (NOTE) If result is NEGATIVE SARS-CoV-2 target nucleic acids are NOT DETECTED. The SARS-CoV-2 RNA is generally detectable in upper and lower  respiratory specimens during the acute phase of infection. The lowest  concentration of SARS-CoV-2 viral copies this assay can detect is 250  copies / mL. A negative result does not preclude SARS-CoV-2 infection  and should not be used as the sole basis for treatment or other  patient management decisions.  A negative result may occur with  improper specimen collection / handling, submission of specimen other  than nasopharyngeal swab, presence of viral mutation(s) within the  areas targeted by this assay, and inadequate number of viral copies  (<250 copies / mL). A negative result must be combined with clinical  observations,  patient history, and epidemiological information. If result is POSITIVE SARS-CoV-2 target nucleic acids are DETECTED. The SARS-CoV-2 RNA is generally detectable in upper and lower  respiratory specimens dur ing the acute phase of infection.  Positive  results are indicative of active infection with SARS-CoV-2.  Clinical  correlation with patient history and other diagnostic information is  necessary to determine patient infection status.  Positive results do  not rule out bacterial infection or co-infection with other viruses. If result is PRESUMPTIVE POSTIVE SARS-CoV-2 nucleic acids MAY  BE PRESENT.   A presumptive positive result was obtained on the submitted specimen  and confirmed on repeat testing.  While 2019 novel coronavirus  (SARS-CoV-2) nucleic acids may be present in the submitted sample  additional confirmatory testing may be necessary for epidemiological  and / or clinical management purposes  to differentiate between  SARS-CoV-2 and other Sarbecovirus currently known to infect humans.  If clinically indicated additional testing with an alternate test  methodology [phone]([id number]) is advised. The SARS-CoV-2 RNA is generally  detectable in upper and lower respiratory sp ecimens during the acute  phase of infection. The expected result is Negative. Fact Sheet for Patients:  BoilerBrush.com.cyhttps://www.fda.gov/media/136312/download Fact Sheet for Healthcare Providers: https://pope.com/https://www.fda.gov/media/136313/download This test is not yet approved or cleared by the [place] FDA and has been authorized for detection and/or diagnosis of SARS-CoV-2 by FDA under an Emergency Use Authorization (EUA).  This EUA will remain in effect (meaning this test can be used) for the duration of the COVID-19 declaration under Section 564(b)(1) of the Act, 21 U.S.C. section 360bbb-3(b)(1), unless the authorization is terminated or revoked sooner. Performed at [facility], [address] [place] [address]   Blood Culture (routine x 2)     Status: None (Preliminary result)   Collection Time: 10/09/18  2:14 AM   Specimen: BLOOD LEFT WRIST  Result Value Ref Range Status   Specimen Description BLOOD LEFT WRIST  Final   Special Requests   Final    BOTTLES DRAWN AEROBIC AND ANAEROBIC Blood Culture adequate volume   Culture   Final    NO GROWTH 4 DAYS Performed at [facility], [address] [place], [place] [address]    Report Status PENDING  Incomplete  Novel Coronavirus, NAA (hospital order; send-out to ref lab)     Status: None   Collection Time: 10/10/18  6:20 PM   Specimen: Nasopharyngeal Swab; Respiratory  Result Value Ref Range Status   SARS-CoV-2, NAA NOT DETECTED NOT DETECTED Final    Comment: (NOTE) This test was developed and its performance characteristics determined by [facility]. This test has not been FDA cleared or approved. This test has been authorized by FDA under an Emergency Use Authorization (EUA). This test is only authorized for the duration of time the declaration that circumstances exist justifying the authorization of the emergency use of in vitro diagnostic tests for detection of SARS-CoV-2 virus and/or diagnosis of COVID-19 infection under section 564(b)(1) of the Act, 21 U.S.C. 478GNF-6(O)(1360bbb-3(b)(1), unless the authorization is terminated or revoked sooner. When diagnostic testing is negative, the possibility of a false negative result should be considered in the context of a patient's recent exposures and the presence of clinical signs and symptoms consistent with COVID-19. An individual without symptoms of COVID-19 and who is not shedding SARS-CoV-2 virus would expect to have a negative (not detected) result in this assay. Performed  At: [facility] [address] [place], [place] [id number] Jolene SchimkeNagendra Sanjai MD NG:[id number]:[phone]    Coronavirus Source NASOPHARYNGEAL  Final    Comment: Performed at [facility], [address] [place], [place] [address]     Time coordinating discharge: 40 minutes  SIGNED:   Alba CoryBelkys A Regalado, MD  [facility]

## 2018-10-14 LAB — CULTURE, BLOOD (ROUTINE X 2)
Culture: NO GROWTH
Culture: NO GROWTH
Special Requests: ADEQUATE
Special Requests: ADEQUATE

## 2019-11-03 ENCOUNTER — Emergency Department (HOSPITAL_COMMUNITY)
Admission: EM | Admit: 2019-11-03 | Discharge: 2019-11-04 | Disposition: A | Discharge status: Home / Self Care | Payer: Medicare Other | Attending: Emergency Medicine | Admitting: Emergency Medicine

## 2019-11-03 ENCOUNTER — Emergency Department (HOSPITAL_COMMUNITY): Payer: Medicare Other

## 2019-11-03 DIAGNOSIS — E119 Type 2 diabetes mellitus without complications: Secondary | ICD-10-CM | POA: Insufficient documentation

## 2019-11-03 DIAGNOSIS — Z79899 Other long term (current) drug therapy: Secondary | ICD-10-CM | POA: Diagnosis not present

## 2019-11-03 DIAGNOSIS — H6123 Impacted cerumen, bilateral: Secondary | ICD-10-CM | POA: Insufficient documentation

## 2019-11-03 DIAGNOSIS — E871 Hypo-osmolality and hyponatremia: Secondary | ICD-10-CM | POA: Insufficient documentation

## 2019-11-03 DIAGNOSIS — I110 Hypertensive heart disease with heart failure: Secondary | ICD-10-CM | POA: Diagnosis not present

## 2019-11-03 DIAGNOSIS — I5032 Chronic diastolic (congestive) heart failure: Secondary | ICD-10-CM | POA: Insufficient documentation

## 2019-11-03 DIAGNOSIS — Z7984 Long term (current) use of oral hypoglycemic drugs: Secondary | ICD-10-CM | POA: Diagnosis not present

## 2019-11-03 DIAGNOSIS — F0390 Unspecified dementia without behavioral disturbance: Secondary | ICD-10-CM | POA: Insufficient documentation

## 2019-11-03 DIAGNOSIS — R5383 Other fatigue: Secondary | ICD-10-CM | POA: Insufficient documentation

## 2019-11-03 DIAGNOSIS — Z20822 Contact with and (suspected) exposure to covid-19: Secondary | ICD-10-CM | POA: Diagnosis not present

## 2019-11-03 DIAGNOSIS — R531 Weakness: Secondary | ICD-10-CM | POA: Diagnosis not present

## 2019-11-03 DIAGNOSIS — Z7982 Long term (current) use of aspirin: Secondary | ICD-10-CM | POA: Insufficient documentation

## 2019-11-03 DIAGNOSIS — F17210 Nicotine dependence, cigarettes, uncomplicated: Secondary | ICD-10-CM | POA: Diagnosis not present

## 2019-11-03 DIAGNOSIS — J449 Chronic obstructive pulmonary disease, unspecified: Secondary | ICD-10-CM | POA: Insufficient documentation

## 2019-11-03 LAB — COMPREHENSIVE METABOLIC PANEL
ALT: 19 U/L (ref 0–44)
AST: 28 U/L (ref 15–41)
Albumin: 3.6 g/dL (ref 3.5–5.0)
Alkaline Phosphatase: 74 U/L (ref 38–126)
Anion gap: 10 (ref 5–15)
BUN: 12 mg/dL (ref 8–23)
CO2: 23 mmol/L (ref 22–32)
Calcium: 9.5 mg/dL (ref 8.9–10.3)
Chloride: 97 mmol/L — ABNORMAL LOW (ref 98–111)
Creatinine, Ser: 0.57 mg/dL — ABNORMAL LOW (ref 0.61–1.24)
GFR calc Af Amer: >60 mL/min (ref >60)
GFR calc non Af Amer: >60 mL/min (ref >60)
Glucose, Bld: 110 mg/dL — ABNORMAL HIGH (ref 70–99)
Potassium: 4.1 mmol/L (ref 3.5–5.1)
Sodium: 130 mmol/L — ABNORMAL LOW (ref 135–145)
Total Bilirubin: 0.6 mg/dL (ref 0.3–1.2)
Total Protein: 6.2 g/dL — ABNORMAL LOW (ref 6.5–8.1)

## 2019-11-03 LAB — CBC WITH DIFFERENTIAL/PLATELET
Abs Immature Granulocytes: 0.02 10*3/uL (ref 0.00–0.07)
Basophils Absolute: 0 10*3/uL (ref 0.0–0.1)
Basophils Relative: 0 %
Eosinophils Absolute: 0 10*3/uL (ref 0.0–0.5)
Eosinophils Relative: 0 %
HCT: 45.1 % (ref 39.0–52.0)
Hemoglobin: 15.2 g/dL (ref 13.0–17.0)
Immature Granulocytes: 0 %
Lymphocytes Relative: 18 %
Lymphs Abs: 1 10*3/uL (ref 0.7–4.0)
MCH: 32.5 pg (ref 26.0–34.0)
MCHC: 33.7 g/dL (ref 30.0–36.0)
MCV: 96.4 fL (ref 80.0–100.0)
Monocytes Absolute: 1 10*3/uL (ref 0.1–1.0)
Monocytes Relative: 18 %
Neutro Abs: 3.5 10*3/uL (ref 1.7–7.7)
Neutrophils Relative %: 64 %
Platelets: 209 10*3/uL (ref 150–400)
RBC: 4.68 MIL/uL (ref 4.22–5.81)
RDW: 13.5 % (ref 11.5–15.5)
WBC: 5.4 10*3/uL (ref 4.0–10.5)
nRBC: 0 % (ref 0.0–0.2)

## 2019-11-03 LAB — LACTIC ACID, PLASMA: Lactic Acid, Venous: 0.8 mmol/L (ref 0.5–1.9)

## 2019-11-03 LAB — URINALYSIS, ROUTINE W REFLEX MICROSCOPIC
Bacteria, UA: NONE SEEN
Bilirubin Urine: NEGATIVE
Glucose, UA: NEGATIVE mg/dL
Hgb urine dipstick: NEGATIVE
Ketones, ur: NEGATIVE mg/dL
Leukocytes,Ua: NEGATIVE
Nitrite: NEGATIVE
Protein, ur: 30 mg/dL — AB
Specific Gravity, Urine: 1.016 (ref 1.005–1.030)
pH: 5 (ref 5.0–8.0)

## 2019-11-03 LAB — PROTIME-INR
INR: 1 (ref 0.8–1.2)
Prothrombin Time: 12.3 seconds (ref 11.4–15.2)

## 2019-11-03 LAB — SARS CORONAVIRUS 2 BY RT PCR (HOSPITAL ORDER, PERFORMED IN ~~LOC~~ HOSPITAL LAB): SARS Coronavirus 2: NEGATIVE

## 2019-11-03 NOTE — ED Triage Notes (Signed)
Pt bib ems from home with reports of weakness, fatigue, decreased po intake, incontinence. Wife dx with Covid last week. 18g LFA. 91% RA, 96% 2L [place] ET CO2 25 120/70 97.7 CBG 95

## 2019-11-04 ENCOUNTER — Emergency Department (HOSPITAL_COMMUNITY): Payer: Medicare Other

## 2019-11-04 NOTE — ED Notes (Signed)
RN assumed care of pt , pt cleaned and brief changed, as well as sheets , pt denies any needs at this time , rails x2 , call light in hand, awaiting PTAR, per secretary 9 people in front of pt, all paperwork at bedside.

## 2019-11-04 NOTE — Progress Notes (Addendum)
Note that this patient no longer has [place], he has [employer] as of September 24, 2019

## 2019-11-04 NOTE — Discharge Instructions (Addendum)
Please increase salt intake (such as with soup/broth, soy sauce, etc) for the next few days and have primary care provider recheck sodium level. Return to ER if any lethargy, fever, vomiting, abdominal pain, or other sudden changes in symptoms.

## 2019-11-04 NOTE — ED Provider Notes (Signed)
[facility] EMERGENCY DEPARTMENT Provider Note   CSN: [id number] Arrival date & time: 11/03/19  1652     History Chief Complaint  Patient presents with   Weakness    Keith Wise is a 79 y.o. male.  79yo M w/ PMH below including COPD, T2DM, HTN, seizures, dCHF who p/w weakness. Pt was brought in by EMS from home for reports of weakness, fatigue and decreased oral intake according to daughter who assists w/ his care at home. His wife is currently admitted for COVID-19 infection.  Daughter tells me over the phone that she is mainly concerned that he has not been eating much.  He was admitted in July 2020 for pneumonia and at that time his discharge summary noted "Per family patient has been getting progressively weaker over the last 6 months has become partially bedbound." Pt tells me he has had no abd pain, vomiting, chest pain, breathing problems, or other complaints.  The history is provided by a relative.  Weakness      Past Medical History:  Diagnosis Date   COPD (chronic obstructive pulmonary disease) (HCC)    Diabetes mellitus    Hypercholesteremia    Hypertension    Seizures (HCC)    last seizure in the 80's    Shortness of breath     Patient Active Problem List   Diagnosis Date Noted   SIRS (systemic inflammatory response syndrome) (HCC) 10/09/2018   Lower extremity weakness 10/09/2018   Community acquired pneumonia of left lower lobe of lung 10/09/2018   Type II or unspecified type diabetes mellitus without mention of complication, uncontrolled 11/05/2013   Gout 11/05/2013   Obesity (BMI 30-39.9) 11/03/2013   COPD (chronic obstructive pulmonary disease) with chronic bronchitis (HCC) 11/02/2013   Chronic diastolic congestive heart failure (HCC) 11/02/2013   Leukocytosis, unspecified 11/02/2013   Acute blood loss anemia 11/02/2013   Left displaced femoral neck fracture (HCC) 10/31/2013   Seizure disorder (HCC) 10/31/2013    Unspecified disorder of thyroid 04/30/2011   Tobacco abuse 04/15/2011   Diabetes mellitus (HCC) 04/15/2011   Hypertension 04/15/2011   Hyperlipidemia 04/15/2011    Past Surgical History:  Procedure Laterality Date   BACK SURGERY     CERVICAL DISC SURGERY     HEMIARTHROPLASTY HIP Left 11/01/2013   dr Eulah Pont   HIP ARTHROPLASTY Left 11/01/2013   Procedure: Left hip  Hemiarthroplasty;  Surgeon: Sheral Apley, MD;  Location: [facility] OR;  Service: Orthopedics;  Laterality: Left;   ORTHOPEDIC SURGERY     lumbar, cervical       Family History  Problem Relation Age of Onset   Diabetes type II Other     Social History   Tobacco Use   Smoking status: Current Every Day Smoker    Packs/day: 2.00    Years: 55.00    Pack years: 110.00    Types: Cigarettes   Smokeless tobacco: Never Used  Substance Use Topics   Alcohol use: No   Drug use: No    Home Medications Prior to Admission medications   Medication Sig Start Date End Date Taking? Authorizing Provider  allopurinol (ZYLOPRIM) 100 MG tablet TAKE 1 TABLET BY MOUTH EVERY DAY Patient taking differently: Take 200 mg by mouth daily.  12/23/13   Oneal Grout, MD  amLODipine (NORVASC) 5 MG tablet Take 1 tablet (5 mg total) by mouth daily. 10/14/18   Regalado, Jon Billings A, MD  aspirin EC 325 MG tablet Take 1 tablet (325 mg total) by  mouth daily. Patient not taking: Reported on 10/10/2018 11/01/13   Sheral Apley, MD  Cholecalciferol (VITAMIN D-3) 125 MCG (5000 UT) TABS Take 5,000 Units by mouth daily.    [provider]  docusate sodium (COLACE) 100 MG capsule Take 1 capsule (100 mg total) by mouth 2 (two) times daily. Continue this while taking narcotics to help with bowel movements Patient not taking: Reported on 10/10/2018 11/01/13   Sheral Apley, MD  folic acid (FOLVITE) 1 MG tablet Take 1 tablet (1 mg total) by mouth daily. 10/14/18   Regalado, Belkys A, MD  gabapentin (NEURONTIN) 300 MG capsule Take 300-600 mg  by mouth See admin instructions. Take 1 capsule (300mg ) in the morning, and 2 capsules (600mg ) every night    [provider]  glimepiride (AMARYL) 2 MG tablet TAKE 1 TABLET BY MOUTH EVERY DAY Patient taking differently: Take 2 mg by mouth daily with breakfast.  12/23/13   , MD  lisinopril (ZESTRIL) 20 MG tablet Take 1 tablet (20 mg total) by mouth daily. 10/14/18   Regalado, Belkys A, MD  lovastatin (MEVACOR) 20 MG tablet Take 20 mg by mouth at bedtime. 08/17/18   [provider]  meloxicam (MOBIC) 7.5 MG tablet Take 7.5 mg by mouth daily as needed for pain. 10/01/18   [provider]  metFORMIN (GLUCOPHAGE) 1000 MG tablet Take 1,000 mg by mouth at bedtime.     [provider]  Psyllium (METAMUCIL FIBER PO) Take 2 tablets by mouth daily.    [provider]  thiamine 100 MG tablet Take 1 tablet (100 mg total) by mouth daily. 10/14/18   Regalado, Belkys A, MD  vitamin B-12 (CYANOCOBALAMIN) 1000 MCG tablet Take 1,000 mcg by mouth daily.    [provider]  colchicine 0.6 MG tablet Take 1 tablet (0.6 mg total) by mouth daily. For 5 days 11/04/13 10/09/18  01/04/14, MD    Allergies    Patient has no known allergies.  Review of Systems   Review of Systems  Unable to perform ROS: Dementia  Neurological: Positive for weakness.    Physical Exam Updated Vital Signs BP 93/77 (BP Location: Right Arm)    Pulse 100    Temp 98.3 F (36.8 C) (Oral)    Resp 18    SpO2 93%   Physical Exam Vitals and nursing note reviewed.  Constitutional:      General: He is not in acute distress.    Appearance: He is well-developed.     Comments: Frail appearing elderly male, NAD  HENT:     Head: Normocephalic and atraumatic.     Right Ear: There is impacted cerumen.     Left Ear: There is impacted cerumen.     Mouth/Throat:     Comments: Mildly dry mouth Eyes:     Conjunctiva/sclera: Conjunctivae normal.     Pupils: Pupils are equal,  round, and reactive to light.  Cardiovascular:     Rate and Rhythm: Normal rate and regular rhythm.     Heart sounds: Normal heart sounds. No murmur heard.   Pulmonary:     Effort: Pulmonary effort is normal.     Breath sounds: Normal breath sounds.  Abdominal:     General: Bowel sounds are normal. There is no distension.     Palpations: Abdomen is soft.     Tenderness: There is no abdominal tenderness.  Musculoskeletal:     Cervical back: Neck supple.     Right  lower leg: No edema.     Left lower leg: No edema.  Skin:    General: Skin is warm and dry.  Neurological:     Mental Status: He is alert.     Comments: Fluent speech, following commands, no facial asymmetry, 5/5 strength BUE, 4/5 strength BLE  Psychiatric:     Comments: Flat affect     ED Results / Procedures / Treatments   Labs (all labs ordered are listed, but only abnormal results are displayed) Labs Reviewed  COMPREHENSIVE METABOLIC PANEL - Abnormal; Notable for the following components:      Result Value   Sodium 130 (*)    Chloride 97 (*)    Glucose, Bld 110 (*)    Creatinine, Ser 0.57 (*)    Total Protein 6.2 (*)    All other components within normal limits  URINALYSIS, ROUTINE W REFLEX MICROSCOPIC - Abnormal; Notable for the following components:   Protein, ur 30 (*)    All other components within normal limits  CULTURE, BLOOD (ROUTINE X 2)  SARS CORONAVIRUS 2 BY RT PCR (HOSPITAL ORDER, PERFORMED IN [facility])  CULTURE, BLOOD (ROUTINE X 2)  LACTIC ACID, PLASMA  CBC WITH DIFFERENTIAL/PLATELET  PROTIME-INR  LACTIC ACID, PLASMA    EKG EKG Interpretation  Date/Time:  Tuesday November 03 2019 16:57:11 EDT Ventricular Rate:  98 PR Interval:  166 QRS Duration: 94 QT Interval:  366 QTC Calculation: 467 R Axis:   -78 Text Interpretation: Sinus rhythm with Premature atrial complexes Incomplete right bundle branch block Left anterior fascicular block Nonspecific T wave abnormality  Prolonged QT Abnormal ECG When compared with ECG of 10/08/2018, No significant change was found Confirmed by Dione BoozeGlick, David ([id number]) on 11/03/2019 11:06:08 PM   Radiology CT Head Wo Contrast  Result Date: 11/04/2019 CLINICAL DATA:  Mental status change, unknown cause; weakness. EXAM: CT HEAD WITHOUT CONTRAST TECHNIQUE: Contiguous axial images were obtained from the base of the skull through the vertex without intravenous contrast. COMPARISON:  Brain MRI 10/09/2018, head CT 10/09/2018 FINDINGS: Brain: Mildly motion degraded examination. Stable, moderate generalized parenchymal atrophy. Unchanged small chronic cortically based infarct within the right middle frontal gyrus (series 3, image 13). Redemonstrated advanced patchy and confluent hypoattenuation within the cerebral white matter which is nonspecific, but consistent with chronic small vessel ischemic disease. There is no acute intracranial hemorrhage. No acute demarcated cortical infarct is identified. No extra-axial fluid collection. No evidence of intracranial mass. No midline shift. Vascular: No hyperdense vessel.  Atherosclerotic calcifications. Skull: Normal. Negative for fracture or focal lesion. Sinuses/Orbits: Visualized orbits show no acute finding. No significant paranasal sinus disease. Small right mastoid effusion. Partial opacification of the external auditory canals bilaterally. IMPRESSION: Mildly motion degraded examination. 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 Stable moderate generalized parenchymal atrophy and advanced chronic small vessel ischemic disease. Small right mastoid effusion. Partial opacification of the external auditory canals bilaterally. Electronically Signed   By: Jackey LogeKyle  Golden DO   On: 11/04/2019 13:49   DG Chest Portable 1 View  Result Date: 11/03/2019 CLINICAL DATA:  Pt bib ems from home with reports of weakness, fatigue, decreased po intake, incontinence. Wife dx with Covid last week.  Hx of HTN, COPD, SOBDiabetic, smoker EXAM: PORTABLE CHEST 1 VIEW COMPARISON:  10/09/2018 FINDINGS: Cardiac silhouette is normal in size. No mediastinal or hilar masses or evidence of adenopathy. Prominent lung markings in the lower lungs, stable from the prior exam. Lungs otherwise clear. No convincing pleural effusion  and no pneumothorax. Skeletal structures are demineralized but grossly intact. IMPRESSION: No acute cardiopulmonary disease. Electronically Signed   By: Amie Portland M.D.   On: 11/03/2019 18:25    Procedures .Ear Cerumen Removal  Date/Time: 11/04/2019 3:32 PM Performed by: Laurence Spates, MD Authorized by: Laurence Spates, MD   Consent:    Consent obtained:  Verbal   Consent given by:  Guardian (daughter) Procedure details:    Location:  L ear and R ear   Procedure type: curette   Post-procedure details:    Patient tolerance of procedure:  Tolerated well, no immediate complications   (including critical care time)  Medications Ordered in ED Medications - No data to display  ED Course  I have reviewed the triage vital signs and the nursing notes.  Pertinent labs & imaging results that were available during my care of the patient were reviewed by me and considered in my medical decision making (see chart for details).    MDM Rules/Calculators/A&P                          Pt alert and comfortable on my exam, denying complaints, VS reassuring. Daughter reports he is bedbound. Labs show UA without infection, NA 130, normal Cr, normal LFTs, normal lactate and CBC, no evidence of infection or dehydration. COVID negative, head CT and chest x-ray negative for acute process.  He has no focal deficits to suggest stroke and his work-up has been reassuring against infection.  Sodium slightly low but not low enough that I feel he would be symptomatic from it.  I spoke with daughter on the phone and discussed work-up results and need for close PCP follow-up to have  sodium rechecked and for reexamination, reassessment of his symptoms.  I did place a case management consult to see if they could assist with help at home as daughter is overwhelmed with trying to care for the patient as well as his wife who is currently hospitalized.  Per daughter's request, I cleaned out ear canals as best patient could tolerate. Pt eating crackers and drinking prior to d/c.  Final Clinical Impression(s) / ED Diagnoses Final diagnoses:  Generalized weakness  Hyponatremia    Rx / DC Orders ED Discharge Orders    None       Jaylia Pettus, Ambrose Finland, MD 11/04/19 1605

## 2019-11-04 NOTE — ED Notes (Addendum)
PTAR has arrived for pt  Pt in NAD as seen leaving

## 2019-11-04 NOTE — Progress Notes (Signed)
[facility])  Referral received for hospice services at home.  Per [facility] manager, pt will not be admitted and is going to d/c back home with his dtr.  [facility] left msg for dtr with instructions to return call.   [facility] will continue to follow and set up services once he is back home.  Wallis Bamberg RN, BSN, CCRN [facility] Liaison

## 2019-11-04 NOTE — ED Notes (Signed)
Pt not answering for vitals.  

## 2019-11-04 NOTE — TOC Initial Note (Signed)
Transition of Care [facility]) - Initial/Assessment Note    Patient Details  Name: Keith Wise MRN: [id number] Date of Birth: November 21, 1940  [facility]) CM/SW Contact:    Lockie Pares, RN Phone Number: 11/04/2019, 4:34 PM  Clinical Narrative:                  Spoke to Dr Clarene Duke to get an understanding of the patient problems. Apparently his wife is here tested positive for COVID. Spoke with daughter. Patient has been in [place] doing rehab up until last year, Has had multiple orthopedic surgeries. Decided that he dd not want to rehab any longer. He is now bedridden at home refusing to get out of the bed. He had up until a few days ago urinated in the urinal . He has his meals brought to him. He eats well. And takes his pills. He has expressed his wishes to die at home, when he was more lucid.  He is now  Getting more advanced in dementia.  His wife is currently in the hospital with positive COVID test. She only had one immunization, however the rest of the family had both.Her second shot was delayed because she was in the hospital. She has been in and out of the hospital the past several months frequently  His wife is confused an unable to make decisions regarding his care.  .  We discussed equipment at home. He has a hospital bed.  And items that he needs. His daughter feels that he wants comfort measures and non aggressive care.  We discussed pallative and hospice care. I called [facility] to call patient daughter and speak to her regarding wha tthey could provide at home.  Patient will need aide for personal care, Rn for medication management and social work for next steps, his daughter did say his wishes were to die at home.   Expected Discharge Plan: Home w Hospice Care Barriers to Discharge: No Barriers Identified   Patient Goals and CMS Choice Patient states their goals for this hospitalization and ongoing recovery are:: home with hospice/ pal;lative care    Choice offered to / list presented to : Adult Children  Expected Discharge Plan and Services Expected Discharge Plan: Home w Hospice Care   Discharge Planning Services: CM Consult Post Acute Care Choice: Hospice Living arrangements for the past 2 months: Skilled Nursing Facility                                      Prior Living Arrangements/Services Living arrangements for the past 2 months: Skilled Nursing Facility Lives with:: Adult Children Patient language and need for interpreter reviewed:: No Do you feel safe going back to the place where you live?: Yes      Need for Family Participation in Patient Care: Yes (Comment) Care giver support system in place?: Yes (comment) Current home services: DME (Has  hospital bed , urinal hospital has som broken elements) Criminal Activity/Legal Involvement Pertinent to Current Situation/Hospitalization: No - Comment as needed  Activities of Daily Living      Permission Sought/Granted      Share Information with NAME: daughter  Permission granted to share info w AGENCY: hospice agency        Emotional Assessment Appearance:: Appears older than stated age     Orientation: : Fluctuating Orientation (Suspected and/or reported Sundowners) Alcohol / Substance Use: Not Applicable  Psych Involvement: No (comment)  Admission diagnosis:  Weakness, Triage, Sepsis Patient Active Problem List   Diagnosis Date Noted  . SIRS (systemic inflammatory response syndrome) (HCC) 10/09/2018  . Lower extremity weakness 10/09/2018  . Community acquired pneumonia of left lower lobe of lung 10/09/2018  . Type II or unspecified type diabetes mellitus without mention of complication, uncontrolled 11/05/2013  . Gout 11/05/2013  . Obesity (BMI 30-39.9) 11/03/2013  . COPD (chronic obstructive pulmonary disease) with chronic bronchitis (HCC) 11/02/2013  . Chronic diastolic congestive heart failure (HCC) 11/02/2013  . Leukocytosis, unspecified  11/02/2013  . Acute blood loss anemia 11/02/2013  . Left displaced femoral neck fracture (HCC) 10/31/2013  . Seizure disorder (HCC) 10/31/2013  . Unspecified disorder of thyroid 04/30/2011  . Tobacco abuse 04/15/2011  . Diabetes mellitus (HCC) 04/15/2011  . Hypertension 04/15/2011  . Hyperlipidemia 04/15/2011   PCP:  Kaleen Mask, MD Pharmacy:   [facility] [phone] [place], [place] - [phone] AT [facility] [address] [place] [address] Phone: [phone] Fax: [phone]     Social Determinants of Health (SDOH) Interventions    Readmission Risk Interventions No flowsheet data found.

## 2019-11-08 LAB — CULTURE, BLOOD (ROUTINE X 2): Culture: NO GROWTH

## 2019-11-25 DEATH — deceased
